# Patient Record
Sex: Female | Born: 1985
Health system: Southern US, Community
[De-identification: ages and names within clinical notes are randomized; demographics above are authoritative.]

## PROBLEM LIST (undated history)

## (undated) DIAGNOSIS — Z8619 Personal history of other infectious and parasitic diseases: Secondary | ICD-10-CM

## (undated) DIAGNOSIS — T7840XA Allergy, unspecified, initial encounter: Secondary | ICD-10-CM

## (undated) DIAGNOSIS — L509 Urticaria, unspecified: Secondary | ICD-10-CM

## (undated) DIAGNOSIS — M5020 Other cervical disc displacement, unspecified cervical region: Secondary | ICD-10-CM

## (undated) DIAGNOSIS — J45909 Unspecified asthma, uncomplicated: Secondary | ICD-10-CM

## (undated) DIAGNOSIS — D649 Anemia, unspecified: Secondary | ICD-10-CM

## (undated) DIAGNOSIS — L309 Dermatitis, unspecified: Secondary | ICD-10-CM

## (undated) HISTORY — DX: Anemia, unspecified: D64.9

## (undated) HISTORY — DX: Personal history of other infectious and parasitic diseases: Z86.19

## (undated) HISTORY — DX: Allergy, unspecified, initial encounter: T78.40XA

## (undated) HISTORY — DX: Unspecified asthma, uncomplicated: J45.909

## (undated) HISTORY — DX: Urticaria, unspecified: L50.9

## (undated) HISTORY — DX: Dermatitis, unspecified: L30.9

---
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
## 2016-01-01 LAB — HEMOGLOBIN A1C: Hemoglobin A1C: 5.5

## 2016-01-01 LAB — LIPID PANEL
CHOLESTEROL: 194 (ref 0–200)
HDL: 82 — AB (ref 35–70)
LDL CALC: 101
TRIGLYCERIDES: 55 (ref 40–160)

## 2016-01-01 LAB — BASIC METABOLIC PANEL
BUN: 6 (ref 4–21)
Creatinine: 1 (ref 0.5–1.1)
GLUCOSE: 70
Potassium: 4.1 (ref 3.4–5.3)
SODIUM: 135 — AB (ref 137–147)

## 2016-01-01 LAB — CBC AND DIFFERENTIAL
HEMATOCRIT: 36 (ref 36–46)
HEMOGLOBIN: 9.9 — AB (ref 12.0–16.0)
PLATELETS: 328 (ref 150–399)
WBC: 9.4

## 2016-01-01 LAB — PULMONARY FUNCTION TEST

## 2016-01-01 LAB — HEPATIC FUNCTION PANEL
ALK PHOS: 72 (ref 25–125)
ALT: 9 (ref 7–35)
AST: 15 (ref 13–35)
BILIRUBIN, TOTAL: 0.3

## 2016-01-01 LAB — HM PAP SMEAR

## 2016-01-01 LAB — VITAMIN D 25 HYDROXY (VIT D DEFICIENCY, FRACTURES): Vit D, 25-Hydroxy: 11.79

## 2016-01-01 LAB — HM HIV SCREENING LAB: HM HIV Screening: NEGATIVE

## 2016-01-01 LAB — HIV ANTIBODY (ROUTINE TESTING W REFLEX): HIV: NEGATIVE

## 2016-01-01 LAB — TSH: TSH: 2.14 (ref <=5.90)

## 2016-01-14 LAB — PULMONARY FUNCTION TEST

## 2016-01-19 LAB — VITAMIN B12: Vitamin B-12: 485

## 2017-06-23 ENCOUNTER — Other Ambulatory Visit: Payer: Self-pay

## 2017-06-23 ENCOUNTER — Encounter (HOSPITAL_COMMUNITY): Payer: Self-pay | Admitting: Emergency Medicine

## 2017-06-23 ENCOUNTER — Ambulatory Visit (INDEPENDENT_AMBULATORY_CARE_PROVIDER_SITE_OTHER): Payer: Self-pay | Admitting: Emergency Medicine

## 2017-06-23 ENCOUNTER — Ambulatory Visit (HOSPITAL_COMMUNITY)
Admission: EM | Admit: 2017-06-23 | Discharge: 2017-06-23 | Disposition: A | Discharge status: Home / Self Care | Payer: No Typology Code available for payment source | Attending: Family Medicine | Admitting: Family Medicine

## 2017-06-23 VITALS — BP 130/85 | HR 92 | Temp 98.3°F | Resp 16 | Wt 300.8 lb

## 2017-06-23 DIAGNOSIS — T7840XA Allergy, unspecified, initial encounter: Secondary | ICD-10-CM | POA: Diagnosis not present

## 2017-06-23 HISTORY — DX: Other cervical disc displacement, unspecified cervical region: M50.20

## 2017-06-23 MED ORDER — METHYLPREDNISOLONE ACETATE 80 MG/ML IJ SUSP
INTRAMUSCULAR | Status: AC
Start: 2017-06-23 — End: 2017-06-23
  Filled 2017-06-23: qty 1

## 2017-06-23 MED ORDER — METHYLPREDNISOLONE ACETATE 80 MG/ML IJ SUSP
80.0000 mg | Freq: Once | INTRAMUSCULAR | Status: AC
  Administered 2017-06-23: 80 mg via INTRAMUSCULAR

## 2017-06-23 NOTE — ED Provider Notes (Signed)
[facility]   [id number] 06/23/17 Arrival Time: 1817   SUBJECTIVE:  Patty Bates is a 32 y.o. female who presents to the urgent care with complaint of rash on back of neck, itchy eyes, ears, throat, chills at night. Pt requesting "shot for allergies".  She went to the symphony on Sunday night and drank wine which she states had sulfites in them.  This caused her eyes to get red and she developed an itchy rash which is only partially responded to Benadryl.   Note from other [facility] facility today: 32 year old female [facility] with a chief complaint of allergic reaction. She reports she is having a reaction to wine, and that she has had similar reactions in the past to peanuts and to wine. She states she had swelling in her throat, itching and rash over the last four days, she has treated this with benadryl and the majority of her symptoms have improved but continues to have itching on the back of her neck and the back of her legs. She denies sensation of difficulty breathing, shortness of breath, sensation of throat closing, or other acute complaints.  Past Medical History:  Diagnosis Date  . Herniated cervical disc    No family history on file. Social History   Socioeconomic History  . Marital status: Unknown    Spouse name: Not on file  . Number of children: Not on file  . Years of education: Not on file  . Highest education level: Not on file  Social Needs  . Financial resource strain: Not on file  . Food insecurity - worry: Not on file  . Food insecurity - inability: Not on file  . Transportation needs - medical: Not on file  . Transportation needs - non-medical: Not on file  Occupational History  . Not on file  Tobacco Use  . Smoking status: Not on file  Substance and Sexual Activity  . Alcohol use: Not on file  . Drug use: Not on file  . Sexual activity: Not on file  Other Topics Concern  . Not on file  Social History Narrative  . Not on file     Current Meds  Medication Sig  . DiphenhydrAMINE HCl (BENADRYL ALLERGY PO) Take by mouth.  . FERROUS GLUCONATE IRON PO Take by mouth.  . Fexofenadine HCl (ALLEGRA ALLERGY PO) Take by mouth.  Marland Kitchen. guaiFENesin (MUCINEX) 600 MG 12 hr tablet Take by mouth 2 (two) times daily.  . vitamin B-12 (CYANOCOBALAMIN) 100 MCG tablet Take 100 mcg by mouth daily.   Allergies  Allergen Reactions  . Aspirin   . Ceftriaxone   . Sulfa Antibiotics       ROS: As per HPI, remainder of ROS negative.   OBJECTIVE:   Vitals:   06/23/17 1835  BP: (!) 151/91  Pulse: 97  Resp: 18  Temp: 98.1 F (36.7 C)  SpO2: 100%     General appearance: alert; no distress Eyes: PERRL; EOMI; conjunctiva normal HENT: normocephalic; atraumatic; TMs normal, canal normal, external ears normal without trauma; nasal mucosa normal; oral mucosa normal Neck: supple Lungs: clear to auscultation bilaterally Heart: regular rate and rhythm Back: no CVA tenderness Extremities: no cyanosis or edema; symmetrical with no gross deformities Skin: warm and dry; fine sandpaper type rash over shoulders and posterior neck Neurologic: normal gait; grossly normal Psychological: alert and cooperative; normal mood and affect      Labs:  No results found for this or any previous visit.  Labs Reviewed - No data to display  No results found.     ASSESSMENT & PLAN:  1. Allergic reaction, initial encounter     Meds ordered this encounter  Medications  . methylPREDNISolone acetate (DEPO-MEDROL) injection 80 mg    Reviewed expectations re: course of current medical issues. Questions answered. Outlined signs and symptoms indicating need for more acute intervention. Patient verbalized understanding. After Visit Summary given.       Elvina Sidle, MD 06/23/17 [phone]

## 2017-06-23 NOTE — Progress Notes (Signed)
S: 32 year old female [facility] with a chief complaint of allergic reaction. She reports she is having a reaction to wine, and that she has had similar reactions in the past to peanuts and to wine. She states she had swelling in her throat, itching and rash over the last four days, she has treated this with benadryl and the majority of her symptoms have improved but continues to have itching on the back of her neck and the back of her legs. She denies sensation of difficulty breathing, shortness of breath, sensation of throat closing, or other acute complaints.  ROS otherwise negative.  O:  Vitals:   06/23/17 1738  BP: 130/85  Pulse: 92  Resp: 16  Temp: 98.3 F (36.8 C)  SpO2: 100%   Physical Exam  Constitutional: She is well-developed, well-nourished, and in no distress.  HENT:  Head: Normocephalic and atraumatic.  Right Ear: Tympanic membrane and external ear normal.  Left Ear: External ear normal.  Nose: Nose normal. Right sinus exhibits no maxillary sinus tenderness and no frontal sinus tenderness. Left sinus exhibits no maxillary sinus tenderness and no frontal sinus tenderness.  Mouth/Throat: Uvula is midline and oropharynx is clear and moist. No oropharyngeal exudate.  Eyes: Conjunctivae are normal.  Neck: Neck supple.  Cardiovascular: Normal rate and regular rhythm.  Pulmonary/Chest: Effort normal and breath sounds normal. No respiratory distress. She has no wheezes.  Lymphadenopathy:    She has no cervical adenopathy.  Neurological: She is alert.  Skin: Skin is warm and dry. No rash noted. No erythema.  Nursing note and vitals reviewed.  A: Possible allergic reaction  P: Patient requesting steroid injection tonight. It was explained that we do not have injectable medications at this location. Prescription for prednisone and Atarax offered. Patient declined. Recommend continuing Benadryl, if symptoms worsen or fail to resolve return to clinic or follow up at the  ER as needed.

## 2017-06-23 NOTE — ED Triage Notes (Signed)
Pt has taken benadryl and mucinex.

## 2017-06-23 NOTE — ED Triage Notes (Signed)
Pt c/o rash on back of neck, itchy eyes, ears, throat, chills at night. Pt requesting "shot for allergies".

## 2017-06-23 NOTE — Patient Instructions (Signed)
Allergies An allergy is when your body reacts to a substance in a way that is not normal. An allergic reaction can happen after you:  Eat something.  Breathe in something.  Touch something.  You can be allergic to:  Things that are only around during certain seasons, like molds and pollens.  Foods.  Drugs.  Insects.  Animal dander.  What are the signs or symptoms?  Puffiness (swelling). This may happen on the lips, face, tongue, mouth, or throat.  Sneezing.  Coughing.  Breathing loudly (wheezing).  Stuffy nose.  Tingling in the mouth.  A rash.  Itching.  Itchy, red, puffy areas of skin (hives).  Watery eyes.  Throwing up (vomiting).  Watery poop (diarrhea).  Dizziness.  Feeling faint or fainting.  Trouble breathing or swallowing.  A tight feeling in the chest.  A fast heartbeat. How is this diagnosed? Allergies can be diagnosed with:  A medical and family history.  Skin tests.  Blood tests.  A food diary. A food diary is a record of all the foods, drinks, and symptoms you have each day.  The results of an elimination diet. This diet involves making sure not to eat certain foods and then seeing what happens when you start eating them again.  How is this treated? There is no cure for allergies, but allergic reactions can be treated with medicine. Severe reactions usually need to be treated at a hospital. How is this prevented? The best way to prevent an allergic reaction is to avoid the thing you are allergic to. Allergy shots and medicines can also help prevent reactions in some cases. This information is not intended to replace advice given to you by your health care provider. Make sure you discuss any questions you have with your health care provider. Document Released: 09/10/2012 Document Revised: 01/11/2016 Document Reviewed: 02/25/2014 Elsevier Interactive Patient Education  2018 Elsevier Inc.  

## 2017-09-01 ENCOUNTER — Ambulatory Visit: Payer: No Typology Code available for payment source | Admitting: Physician Assistant

## 2017-09-04 ENCOUNTER — Encounter: Payer: Self-pay | Admitting: Physician Assistant

## 2017-09-04 ENCOUNTER — Ambulatory Visit (INDEPENDENT_AMBULATORY_CARE_PROVIDER_SITE_OTHER): Payer: No Typology Code available for payment source | Admitting: Physician Assistant

## 2017-09-04 VITALS — BP 150/98 | HR 94 | Temp 98.7°F | Ht 65.5 in | Wt 299.0 lb

## 2017-09-04 DIAGNOSIS — R030 Elevated blood-pressure reading, without diagnosis of hypertension: Secondary | ICD-10-CM

## 2017-09-04 DIAGNOSIS — E669 Obesity, unspecified: Secondary | ICD-10-CM | POA: Diagnosis not present

## 2017-09-04 DIAGNOSIS — R635 Abnormal weight gain: Secondary | ICD-10-CM | POA: Diagnosis not present

## 2017-09-04 DIAGNOSIS — Z7689 Persons encountering health services in other specified circumstances: Secondary | ICD-10-CM | POA: Diagnosis not present

## 2017-09-04 DIAGNOSIS — T7840XA Allergy, unspecified, initial encounter: Secondary | ICD-10-CM | POA: Diagnosis not present

## 2017-09-04 LAB — CBC WITH DIFFERENTIAL/PLATELET
BASOS PCT: 0.5 % (ref 0.0–3.0)
Basophils Absolute: 0 10*3/uL (ref 0.0–0.1)
Eosinophils Absolute: 0.1 10*3/uL (ref 0.0–0.7)
Eosinophils Relative: 1.1 % (ref 0.0–5.0)
HCT: 34.7 % — ABNORMAL LOW (ref 36.0–46.0)
HEMOGLOBIN: 11.2 g/dL — AB (ref 12.0–15.0)
LYMPHS ABS: 1.9 10*3/uL (ref 0.7–4.0)
Lymphocytes Relative: 21.8 % (ref 12.0–46.0)
MCHC: 32.2 g/dL (ref 30.0–36.0)
MCV: 77.5 fl — ABNORMAL LOW (ref 78.0–100.0)
MONO ABS: 0.4 10*3/uL (ref 0.1–1.0)
Monocytes Relative: 4.7 % (ref 3.0–12.0)
NEUTROS ABS: 6.2 10*3/uL (ref 1.4–7.7)
Neutrophils Relative %: 71.9 % (ref 43.0–77.0)
Platelets: 323 10*3/uL (ref 150.0–400.0)
RBC: 4.48 Mil/uL (ref 3.87–5.11)
RDW: 17.9 % — AB (ref 11.5–15.5)
WBC: 8.6 10*3/uL (ref 4.0–10.5)

## 2017-09-04 LAB — COMPREHENSIVE METABOLIC PANEL
ALBUMIN: 3.9 g/dL (ref 3.5–5.2)
ALT: 11 U/L (ref 0–35)
AST: 12 U/L (ref 0–37)
Alkaline Phosphatase: 72 U/L (ref 39–117)
BUN: 6 mg/dL (ref 6–23)
CHLORIDE: 102 meq/L (ref 96–112)
CO2: 27 mEq/L (ref 19–32)
Calcium: 9.4 mg/dL (ref 8.4–10.5)
Creatinine, Ser: 0.73 mg/dL (ref 0.40–1.20)
GFR: 98.92 mL/min (ref >60.00)
Glucose, Bld: 82 mg/dL (ref 70–99)
POTASSIUM: 4.4 meq/L (ref 3.5–5.1)
SODIUM: 136 meq/L (ref 135–145)
Total Bilirubin: 0.4 mg/dL (ref 0.2–1.2)
Total Protein: 7.9 g/dL (ref 6.0–8.3)

## 2017-09-04 LAB — TSH: TSH: 1.15 u[IU]/mL (ref 0.35–4.50)

## 2017-09-04 LAB — HEMOGLOBIN A1C: HEMOGLOBIN A1C: 5.8 % (ref 4.6–6.5)

## 2017-09-04 MED ORDER — EPINEPHRINE 0.3 MG/0.3ML IJ SOAJ: 0.3000 mg | Freq: Once | INTRAMUSCULAR | 0 refills | Status: AC

## 2017-09-04 MED FILL — EPINEPHRINE 0.3 MG AUTO-INJ: 0.3 | 30 days supply | Qty: 2 | Fill #0

## 2017-09-04 NOTE — Patient Instructions (Signed)
It was great to meet you!  Keep an eye on blood pressure - if consistently >140/90, please let me know.  You will be contacted about your allergy referral.

## 2017-09-04 NOTE — Progress Notes (Signed)
Patty Bates is a 32 y.o. female here to Establish Care  I acted as a Neurosurgeon for [employer], PA-C Corky Mull, LPN  History of Present Illness:   Chief Complaint  Patient presents with  . Establish Care  . Allergies  . Diet and Nutrition    Acute Concerns: Elevated blood pressure reading -- Currently not taking any medications for blood pressure. At home blood pressure readings are: 140/100.  Patient denies chest pain, SOB, blurred vision, dizziness, unusual headaches, lower leg swelling. Denies excessive caffeine intake, stimulant usage, excessive alcohol intake, or increase in salt consumption. Uses "some salt." Eats out 50% of the time. Was under a lot of stress over the past year but lately things have improved. She is interested in diet and nutrition ans she thinks that her weight gain is contributing to this. BP Readings from Last 3 Encounters:  09/04/17 (!) 150/98  06/23/17 (!) 151/91  06/23/17 130/85   Allergies -- has issues with eczema. Takes Allegra daily. 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 Had a pretty severe reaction to wine in January 2019 (swelling in throat, itching/rash), required depo-medrol. Does not have Epi-Pen. Obesity -- was 260 lb last year, was working out at [employer]	[place]. Had to discontinue to her membership because her back pain was so severe. Her pain has improved and now is walking and doing beginner Zumba. She feels like her recent weight gain is attributing to her blood pressure.   Health Maintenance: Immunizations -- up to date Weight -- Weight: 299 lb (135.6 kg)    Depression screen [facility] 2/9 09/04/2017  Decreased Interest 0  Down, Depressed, Hopeless 0  PHQ - 2 Score 0    No flowsheet data found.    Past Medical History:  Diagnosis Date  . Allergy   . Anemia   . Herniated cervical disc   . History of chicken pox      Social History   Socioeconomic History  . Marital status: Unknown    Spouse name: Not  on file  . Number of children: Not on file  . Years of education: Not on file  . Highest education level: Not on file  Occupational History  . Not on file  Social Needs  . Financial resource strain: Not on file  . Food insecurity:    Worry: Not on file    Inability: Not on file  . Transportation needs:    Medical: Not on file    Non-medical: Not on file  Tobacco Use  . Smoking status: Never Smoker  . Smokeless tobacco: Never Used  Substance and Sexual Activity  . Alcohol use: Not Currently    Frequency: Never    Comment: rarely  . Drug use: Never  . Sexual activity: Never    Birth control/protection: None  Lifestyle  . Physical activity:    Days per week: Not on file    Minutes per session: Not on file  . Stress: Not on file  Relationships  . Social connections:    Talks on phone: Not on file    Gets together: Not on file    Attends religious service: Not on file    Active member of club or organization: Not on file    Attends meetings of clubs or organizations: Not on file    Relationship status: Not on file  . Intimate partner violence:    Fear of current or ex partner: Not on file    Emotionally abused: Not on  file    Physically abused: Not on file    Forced sexual activity: Not on file  Other Topics Concern  . Not on file  Social History Narrative   Cardiac sonographer for [employer]   Single   No children   Fun: travel    Past Surgical History:  Procedure Laterality Date  . WISDOM TOOTH EXTRACTION Bilateral 2004    Family History  Problem Relation Age of Onset  . Prostate cancer Father   . Breast cancer Neg Hx   . Colon cancer Neg Hx   . Stroke Neg Hx   . Heart attack Neg Hx   . Diabetes Neg Hx     Allergies  Allergen Reactions  . Aspirin   . Ceftriaxone   . Sulfa Antibiotics      Current Medications:   Current Outpatient Medications:  .  DiphenhydrAMINE HCl (BENADRYL ALLERGY PO), Take 1 tablet by mouth as needed. , Disp: , Rfl:  .   EPINEPHrine 0.3 mg/0.3 mL IJ SOAJ injection, Inject 0.3 mLs (0.3 mg total) into the muscle once for 1 dose., Disp: 1 Device, Rfl: 0 .  FERROUS GLUCONATE IRON PO, Take 1 tablet by mouth daily. , Disp: , Rfl:  .  Fexofenadine HCl (ALLEGRA ALLERGY PO), Take 1 tablet by mouth daily. , Disp: , Rfl:  .  vitamin B-12 (CYANOCOBALAMIN) 100 MCG tablet, Take 100 mcg by mouth daily., Disp: , Rfl:    Review of Systems:   ROS  Negative unless otherwise specified per HPI.   Vitals:   Vitals:   09/04/17 1100 09/04/17 1126  BP: (!) 150/96 (!) 150/98  Pulse: 94   Temp: 98.7 F (37.1 C)   TempSrc: Oral   SpO2: 99%   Weight: 299 lb (135.6 kg)   Height: 5' 5.5" (1.664 m)      Body mass index is 49 kg/m.  Physical Exam:   Physical Exam  Constitutional: She appears well-developed. She is cooperative.  Non-toxic appearance. She does not have a sickly appearance. She does not appear ill. No distress.  Cardiovascular: Normal rate, regular rhythm, S1 normal, S2 normal, normal heart sounds and normal pulses.  No LE edema  Pulmonary/Chest: Effort normal and breath sounds normal.  Neurological: She is alert. GCS eye subscore is 4. GCS verbal subscore is 5. GCS motor subscore is 6.  Skin: Skin is warm, dry and intact.  No rashes today  Psychiatric: She has a normal mood and affect. Her speech is normal and behavior is normal.  Nursing note and vitals reviewed.   Assessment and Plan:    Patty Bates was seen today for establish care, allergies and diet and nutrition.  Diagnoses and all orders for this visit:  Allergic state, initial encounter Provided patient with Epi-Pen rx at today's visit. Continue Allegra. She would like a referral to an Allergist for further testing as her reactions seem to be worsening. I have put this in for her. I recommended that if she has any severe reactions, go to the ER and carry Epi-Pen with her wherever she goes. -     Ambulatory referral to Allergy  Weight gain and  Obesity She is planning to return for nutrition visit with me for discussion of weight loss. -     Hemoglobin A1c  Elevated blood pressure reading No red flags on exam today. Will check labs to r/o organic cause. I would like for her to follow up with us in 2 weeks when she returns for weight  loss discussion to allow [place] to re-check her BP. I asked her to keep a blood pressure log in the meantime. Discussed blood pressure medication options, we are considering Norvasc or Lisinopril.  -     TSH -     CBC with Differential/Platelet -     Comprehensive metabolic panel  Encounter to establish care  Other orders -     EPINEPHrine 0.3 mg/0.3 mL IJ SOAJ injection; Inject 0.3 mLs (0.3 mg total) into the muscle once for 1 dose.    . Reviewed expectations re: course of current medical issues. . Discussed self-management of symptoms. . Outlined signs and symptoms indicating need for more acute intervention. . Patient verbalized understanding and all questions were answered. . See orders for this visit as documented in the electronic medical record. . Patient received an After-Visit Summary.   CMA or LPN served as scribe during this visit. History, Physical, and Plan performed by medical provider. Documentation and orders reviewed and attested to.   Jarold Motto, PA-C

## 2017-09-05 ENCOUNTER — Encounter: Payer: Self-pay | Admitting: Physician Assistant

## 2017-09-05 ENCOUNTER — Encounter: Payer: Self-pay | Admitting: *Deleted

## 2017-09-05 DIAGNOSIS — D649 Anemia, unspecified: Secondary | ICD-10-CM | POA: Insufficient documentation

## 2017-09-05 DIAGNOSIS — E559 Vitamin D deficiency, unspecified: Secondary | ICD-10-CM | POA: Insufficient documentation

## 2017-09-05 LAB — FERRITIN: Ferritin: 3.6

## 2017-09-05 LAB — FOLATE: Folate: 7.31

## 2017-10-02 ENCOUNTER — Ambulatory Visit: Payer: No Typology Code available for payment source | Admitting: Allergy & Immunology

## 2017-10-02 ENCOUNTER — Encounter: Payer: Self-pay | Admitting: Allergy & Immunology

## 2017-10-02 VITALS — BP 134/96 | HR 88 | Temp 98.2°F | Resp 16 | Ht 64.8 in | Wt 303.8 lb

## 2017-10-02 DIAGNOSIS — J683 Other acute and subacute respiratory conditions due to chemicals, gases, fumes and vapors: Secondary | ICD-10-CM

## 2017-10-02 DIAGNOSIS — T781XXD Other adverse food reactions, not elsewhere classified, subsequent encounter: Secondary | ICD-10-CM

## 2017-10-02 DIAGNOSIS — J310 Chronic rhinitis: Secondary | ICD-10-CM | POA: Diagnosis not present

## 2017-10-02 DIAGNOSIS — R21 Rash and other nonspecific skin eruption: Secondary | ICD-10-CM

## 2017-10-02 MED ORDER — ALBUTEROL SULFATE HFA 108 (90 BASE) MCG/ACT IN AERS: 2.0000 | INHALATION_SPRAY | RESPIRATORY_TRACT | 1 refills | Status: DC | PRN

## 2017-10-02 MED FILL — VENTOLIN HFA 90 MCG INHALER: 108 (90 BAS | 18 days supply | Qty: 18 | Fill #0

## 2017-10-02 NOTE — Progress Notes (Signed)
NEW PATIENT  Date of Service/Encounter:  10/02/17  Referring provider: Jarold Motto, PA   Assessment:   Reactive airways dysfunction syndrome  Adverse food reaction - with questionable reactions not consistent with a food allergy  Chronic rhinitis  Plan/Recommendations:   1. Reactive airways dysfunction syndrome - Your history is most consistent with reactive airway dysfunction syndrome, in which your lungs become hyper-reactive to scents and certain triggers. - In particularly serious cases, we can consider starting a daily controller medication, such as an inhaled steroid. - However you seem to have your symptoms controlled with avoidance.  - I would recommend using 4 puffs of albuterol every 4-6 hours as needed for the coughing episodes.   2. Adverse food reaction - multiple intolerances - Testing was positive to: Cottonseed, White Potato and [employer] - Avoid the above foods for now.  - We will get blood work to look for a red meat allergy. - Testing was negative to Peanut, Soy, Wheat, Sesame, Milk, Egg, Casein, Shellfish Mix , Fish Mix, Cashew, [place], [place], [place], [place], [place] nut, [place], [place], [place], [place], [place], [place], [place], [place], [employer] [place], [place], [place], [place], [place], [place], Box, Oat, Rye, Hops, Rice, YUM[employer] Brands, Sunflower, [place], Chicken, [place], [place], Tomato, Sweet Potato, Green Pea, Mushroom, Avocado, Onion, Cabbage, Carrots, Celery, Corn, Cucumber, Grape, Orange, Banana, Apple, Peach, Strawberry, Cantaloupe, Watermelon , Pineapple, Chocolate, Karaya Gum, Acacia (Arabic Gum), Cinnamon , Nutmeg, Ginger, Garlic, Black Pepper and Mustard - Training for epinephrine auto-injectors provided: EpiPen - There is a the low positive predictive value of food allergy testing and hence the high possibility of false positives. - In contrast, food allergy testing has a high negative predictive value, therefore if testing is negative we can be  relatively assured that they are indeed negative.  - We will get some screening labs for autoimmune issues as well since your history is rather odd. - We will get your outside records from [facility].   3. Return in about 3 months (around 01/02/2018).  Subjective:   COLE KLUGH is a 32 y.o. female presenting today for evaluation of  Chief Complaint  Patient presents with  . Rash  . Ear Problem  . Hypertension    while off antihistamine    Chantele G Giannetti has a history of the following: Patient Active Problem List   Diagnosis Date Noted  . Vitamin D deficiency 09/05/2017  . Anemia 09/05/2017    History obtained from: chart review and patient.  Kimberli G Starner was referred by Jarold Motto, PA.      Rey is a 32 y.o. female presenting for evaluation of a "rashes, coughing, and blood pressure increases". These seem to be triggered by foods, although the history is quite vague. She is a poor historian overall.   These rash sticks around for a week or two. This is pruritic and the rash turns slightly dark and dusky before resolving. They are typically located on the back of her neck, back of thighs, and on her chest. She estimates that she has these around once every couple of months. Apples are definitely a trigger, and she lists a multitude of other foods as well. She has reactions to rice, including rectal bleeding for five days. She reports rash and hypertension with certain foods. Apples are one trigger. She thinks that maple is a trigger as well. Pineapples cause blurred vision. Nuts causes a rash "all over" within 4-5 hours. Peanuts, cashews, almonds, and walnuts result in hives. She does  eat meat twice daily ([place] sausage, chicken/fish/hambutrger for lunch or dinner). She seems to have a reaction to every food when asked about it, although none of these seem to be consistent with a food allergy. She does have an EpiPen. She denies tick bites.   She went to  Urgent Care after she was exposed to wine fumes at the opera. This was in January 2019. She now stays away from places that serve alcohol. The same reaction occurs with vinegar. The wine and vinegar and watermelon fumes trigger her breathing problems with sinus flaring and drainage in the back of her throat. She does not have a diagnosis of asthma and is not on a daily controller medications. She works at [employer]. 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 She stays away from soap scents and perfumes.   She did have testing performed for foods and environmental. These were done at [facility] within the last few years, although she is not aware of the findings whatsoever.   Asthma/Respiratory Symptom History: She has had a pulmonary function testing performed that was  "negative for asthma" [facility]). She is not sure whether there was a methacholine challenge performed. She reports that there was no reversibility, although again I do not have the testing results with me today.   Allergic Rhinitis Symptom History: She does have a reaction to tree pollens. She is currently on Allegra daily and Benadryl at night as needed. She had testing performed in 2018 or 2017. She does not want to be retested for environmental allergens today.    Otherwise, there is no history of other atopic diseases, including asthma, stinging insect allergies, or urticaria. There is no significant infectious history. Vaccinations are up to date.    Past Medical History: Patient Active Problem List   Diagnosis Date Noted  . Vitamin D deficiency 09/05/2017  . Anemia 09/05/2017    Medication List:  Allergies as of 10/02/2017      Reactions   Aspirin    Ceftriaxone    Sulfa Antibiotics       Medication List        Accurate as of 10/02/17 11:59 PM. Always use your most recent med list.          albuterol 108  (90 Base) MCG/ACT inhaler Commonly known as:  PROAIR HFA Inhale 2 puffs into the lungs every 4 (four) hours as needed for wheezing or shortness of breath.   ALLEGRA ALLERGY PO Take 1 tablet by mouth daily.   BENADRYL ALLERGY PO Take 1 tablet by mouth as needed.   EPINEPHrine 0.3 mg/0.3 mL Soaj injection Commonly known as:  EPI-PEN   FERROUS GLUCONATE IRON PO Take 1 tablet by mouth daily.   vitamin B-12 100 MCG tablet Commonly known as:  CYANOCOBALAMIN Take 100 mcg by mouth daily.       Birth History: non-contributory.   Developmental History: non-contributory.   Past Surgical History: Past Surgical History:  Procedure Laterality Date  . WISDOM TOOTH EXTRACTION Bilateral 2004     Family History: Family History  Problem Relation Age of Onset  . Prostate cancer Father   . Hyperlipidemia Father   . Eczema Sister   . Kidney disease Maternal Grandmother   . Arthritis Maternal Grandmother   . Arthritis Paternal Grandmother   . Food Allergy Maternal Aunt        tree nuts  .  Breast cancer Neg Hx   . Colon cancer Neg Hx   . Stroke Neg Hx   . Heart attack Neg Hx   . Diabetes Neg Hx   . Allergic rhinitis Neg Hx   . Atopy Neg Hx   . Urticaria Neg Hx      Social History: Vallerie lives at home in an apartment that is 31 years old. There is laminate in the main living areas and carpeting in the bedrooms. They have electric heating and central cooling. There are no animals inside of the home. There are no dust mite coverings on the bedding. There is no tobacco exposure in the home. 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     Review of Systems: a 14-point review of systems is pertinent for what is mentioned in HPI.  Otherwise, all other systems were negative. Constitutional: negative other than that listed in the HPI Eyes: negative other than that listed in  the HPI Ears, nose, mouth, throat, and face: negative other than that listed in the HPI Respiratory: negative other than that listed in the HPI Cardiovascular: negative other than that listed in the HPI Gastrointestinal: negative other than that listed in the HPI Genitourinary: negative other than that listed in the HPI Integument: negative other than that listed in the HPI Hematologic: negative other than that listed in the HPI Musculoskeletal: negative other than that listed in the HPI Neurological: negative other than that listed in the HPI Allergy/Immunologic: negative other than that listed in the HPI    Objective:   Blood pressure (!) 134/96, pulse 88, temperature 98.2 F (36.8 C), temperature source Oral, resp. rate 16, height 5' 4.8" (1.646 m), weight (!) 303 lb 12.8 oz (137.8 kg). Body mass index is 50.87 kg/m.   Physical Exam:  General: Alert, interactive, in no acute distress. Talkative.  Eyes: No conjunctival injection bilaterally, no discharge on the right, no discharge on the left and no Horner-Trantas dots present. PERRL bilaterally. EOMI without pain. No photophobia.  Ears: Right TM pearly gray with normal light reflex, Left TM pearly gray with normal light reflex, Right TM intact without perforation and Left TM intact without perforation.  Nose/Throat: External nose within normal limits and septum midline. Turbinates edematous with clear discharge. Posterior oropharynx erythematous without cobblestoning in the posterior oropharynx. Tonsils 2+ without exudates.  Tongue without thrush. Neck: Supple without thyromegaly. Trachea midline. Adenopathy: no enlarged lymph nodes appreciated in the anterior cervical, occipital, axillary, epitrochlear, inguinal, or popliteal regions. Lungs: Clear to auscultation without wheezing, rhonchi or rales. No increased work of breathing. CV: Normal S1/S2. No murmurs. Capillary refill <2 seconds.  Abdomen: Nondistended, nontender. No  guarding or rebound tenderness. Bowel sounds present in all fields and hypoactive  Skin: Warm and dry, without lesions or rashes. Extremities:  No clubbing, cyanosis or edema. Neuro:   Grossly intact. No focal deficits appreciated. Responsive to questions.  Diagnostic studies:   Allergy Studies:   Full Food Panel: positive to Cottonseed (2x2), White Potato (2x2) and [employer] (2x2) with adequate controls. Negative to Peanut, Soy, Wheat, Sesame, Milk, Egg, Casein, Shellfish Mix , Fish Mix, Cashew, [place], [address], [place], [place], [place] nut, [place], [place], [place], [place], [place], [place], [place], [place], [place], [place], [place], [place], [place], [place], [place], Oat, Rye, Hops, [employer], [address], [place], [place], Chicken, [place], [place], Tomato, Sweet Potato, Green Pea, Mushroom, Avocado, Onion, Cabbage, Carrots, [place],  Corn, Cucumber, Grape, Orange, Banana, Apple, Peach, Strawberry, Cantaloupe, Watermelon , Pineapple, Chocolate, Karaya Gum, Acacia (Arabic Gum), Cinnamon , Nutmeg, Ginger, Garlic, Black Pepper and Mustard   Allergy testing results were read and interpreted by myself, documented by clinical staff.       Malachi Bonds, MD [facility]

## 2017-10-02 NOTE — Patient Instructions (Addendum)
1. Reactive airways dysfunction syndrome - Your history is most consistent with reactive airway dysfunction syndrome, in which your lungs become hyper-reactive to scents and certain triggers. - In particularly serious cases, we can consider starting a daily controller medication, such as an inhaled steroid. - However you seem to have your symptoms controlled with avoidance.  - I would recommend using 4 puffs of albuterol every 4-6 hours as needed for the coughing episodes.   2. Adverse food reaction - multiple intolerances - Testing was positive to: Cottonseed, White Potato and [employer] - Avoid the above foods for now.  - We will get blood work to look for a red meat allergy. - Testing was negative to Peanut, Soy, Wheat, Sesame, Milk, Egg, Casein, Shellfish Mix , Fish Mix, Cashew, [place], [place], [place], [place], [place] nut, [place], [place], [place], [place], [place], [place], [place], [place], [place], [place], [place], [place], [place], [place], [place], Oat, Rye, Hops, Rice, Y[employer]ands, [place], [place], Chicken, [place], [place], Tomato, Sweet Potato, Green Pea, Mushroom, Avocado, Onion, Cabbage, Carrots, Celery, Corn, Cucumber, Grape, Orange, Banana, Apple, Peach, Strawberry, Cantaloupe, Watermelon , Pineapple, Chocolate, Karaya Gum, Acacia (Arabic Gum), Cinnamon , Nutmeg, Ginger, Garlic, Black Pepper and Mustard - Training for epinephrine auto-injectors provided: EpiPen - There is a the low positive predictive value of food allergy testing and hence the high possibility of false positives. - In contrast, food allergy testing has a high negative predictive value, therefore if testing is negative we can be relatively assured that they are indeed negative.  - We will get some screening labs for autoimmune issues as well since your history is rather odd. - We will get your outside records from [facility].   3. Return in about 3 months (around 01/02/2018).   Please inform us of any  Emergency Department visits, hospitalizations, or changes in symptoms. Call us before going to the ED for breathing or allergy symptoms since we might be able to fit you in for a sick visit. Feel free to contact us anytime with any questions, problems, or concerns.  It was a pleasure to meet you today!  Websites that have reliable patient information: 1. American Academy of Asthma, Allergy, and Immunology: www.aaaai.org 2. Food Allergy Research and Education (FARE): foodallergy.org 3. Mothers of Asthmatics: http://www.asthmacommunitynetwork.org 4. American College of Allergy, Asthma, and Immunology: www.acaai.org

## 2017-10-05 ENCOUNTER — Encounter: Payer: Self-pay | Admitting: Allergy & Immunology

## 2017-10-05 DIAGNOSIS — J683 Other acute and subacute respiratory conditions due to chemicals, gases, fumes and vapors: Secondary | ICD-10-CM | POA: Insufficient documentation

## 2017-10-05 DIAGNOSIS — J310 Chronic rhinitis: Secondary | ICD-10-CM | POA: Insufficient documentation

## 2017-10-05 DIAGNOSIS — T781XXA Other adverse food reactions, not elsewhere classified, initial encounter: Secondary | ICD-10-CM | POA: Insufficient documentation

## 2017-10-10 LAB — TRYPTASE: TRYPTASE: 3.4 ug/L (ref 2.2–13.2)

## 2017-10-10 LAB — ALPHA-GAL PANEL
Beef (Bos spp) IgE: <0.1 kU/L (ref <0.35)
Class Interpretation: 0
LAMB CLASS INTERPRETATION: 0
Lamb/Mutton (Ovis spp) IgE: <0.1 kU/L (ref <0.35)
PORK CLASS INTERPRETATION: 0
Pork (Sus spp) IgE: <0.1 kU/L (ref <0.35)

## 2017-10-10 LAB — ANA: ANA: POSITIVE — AB

## 2017-10-10 LAB — IGE+ALLERGENS ZONE 2(30)
Amer Sycamore IgE Qn: 0.39 kU/L — AB
Bahia Grass IgE: 0.11 kU/L — AB
Cat Dander IgE: <0.1 kU/L
Cedar, Mountain IgE: 0.11 kU/L — AB
Cockroach, American IgE: <0.1 kU/L
Common Silver Birch IgE: 1.42 kU/L — AB
D Farinae IgE: <0.1 kU/L
Dog Dander IgE: <0.1 kU/L
Elm, American IgE: 3.3 kU/L — AB
G010-IGE JOHNSON GRASS: 0.14 kU/L — AB
Hickory, White IgE: 0.86 kU/L — AB
IGE (IMMUNOGLOBULIN E), SERUM: 154 [IU]/mL (ref 6–495)
Mucor Racemosus IgE: <0.1 kU/L
Nettle IgE: 0.25 kU/L — AB
Oak, White IgE: 5.88 kU/L — AB
Penicillium Chrysogen IgE: <0.1 kU/L
Pigweed, Rough IgE: 0.8 kU/L — AB
Ragweed, Short IgE: 1.42 kU/L — AB
SWEET GUM IGE RAST QL: 0.83 kU/L — AB
T001-IGE MAPLE/BOX ELDER: 0.38 kU/L — AB
Timothy Grass IgE: <0.1 kU/L
White Mulberry IgE: <0.1 kU/L

## 2017-10-10 LAB — SEDIMENTATION RATE: Sed Rate: 40 mm/hr — ABNORMAL HIGH (ref 0–32)

## 2017-10-10 LAB — C-REACTIVE PROTEIN: CRP: 10.8 mg/L — ABNORMAL HIGH (ref 0.0–4.9)

## 2017-10-11 ENCOUNTER — Encounter: Payer: Self-pay | Admitting: Physician Assistant

## 2017-10-11 ENCOUNTER — Ambulatory Visit (INDEPENDENT_AMBULATORY_CARE_PROVIDER_SITE_OTHER): Payer: No Typology Code available for payment source | Admitting: Physician Assistant

## 2017-10-11 VITALS — BP 150/86 | HR 108 | Temp 99.2°F | Ht 65.5 in | Wt 307.0 lb

## 2017-10-11 DIAGNOSIS — E669 Obesity, unspecified: Secondary | ICD-10-CM | POA: Diagnosis not present

## 2017-10-11 DIAGNOSIS — R030 Elevated blood-pressure reading, without diagnosis of hypertension: Secondary | ICD-10-CM | POA: Diagnosis not present

## 2017-10-11 DIAGNOSIS — R635 Abnormal weight gain: Secondary | ICD-10-CM | POA: Diagnosis not present

## 2017-10-11 DIAGNOSIS — B351 Tinea unguium: Secondary | ICD-10-CM | POA: Diagnosis not present

## 2017-10-11 DIAGNOSIS — Z713 Dietary counseling and surveillance: Secondary | ICD-10-CM

## 2017-10-11 DIAGNOSIS — L309 Dermatitis, unspecified: Secondary | ICD-10-CM

## 2017-10-11 NOTE — Progress Notes (Signed)
Patty Bates is a 32 y.o. female here for Nutrition Consult.  I acted as a Neurosurgeon for [employer], PA-C Corky Mull, LPN  History of Present Illness:   Chief Complaint  Patient presents with  . Nutrition Counseling    HPI   Patient is here to discuss Nutrition and Diet. Pt would like to lose 40 pounds by end of the year. Highest weight is now at 307 lbs, lowest weight 235 lbs 10 years ago. Pt is exercising, walking 30 minutes twice a week for the past 6 months.  Dietary recall: Breakfast: 2 biscuits, [place] sausage, water OR skips on weekend Lunch: food from cafeteria ([place], mashed potatoes, green beans) or [place] [employer] turkey's sub Dinner: often mixed vegetables, protein, and a carbohydrate Snacks: sometimes fruit, reports that she has a chocolate chip cookie every day Beverages: mostly water  She describes multiple food allergies/intolerances.  She is currently being followed by Dr. Malachi Bonds at allergy and asthma.  He last saw her on Oct 02, 2017.  I have reviewed his note.  Allergies/intolerances include: Apples -- rash Rice -- rectal bleeding Maple Pineapples -- blurred vision Nuts -- rash "all over" within 4-5 hours (peanuts, cashews, almonds and walnuts) Wine Vinegar Watermelon  Full allergy food panel performed at allergy center shows confirmed allergy to: cottonseed, white potato and navy bean.  Dr. Dellis Anes performed autoimmune testing, she was found to have an increased sed rate, increased C-reactive protein, and a positive ANA.  She has been referred to rheumatology.  Per his note "she seems to have a reaction to every food when asked about it"  Weight: Wt Readings from Last 3 Encounters:  10/11/17 (!) 307 lb (139.3 kg)  10/02/17 (!) 303 lb 12.8 oz (137.8 kg)  09/04/17 299 lb (135.6 kg)   Exercise: Tries to walk 30 minutes a day  Goals: 1-lose weight 2-reduce blood pressure through diet  Estimated daily energy needs: Calories: 1600 -  1800 kcal Protein: 60-75 g Fluid: 2000 ml  Elevated blood pressure Currently not on medications. At home blood pressure readings are: <130 systolic and <90 diastolic. Patient denies chest pain, SOB, blurred vision, dizziness, unusual headaches, lower leg swelling. Denies excessive caffeine intake, stimulant usage, excessive alcohol intake, or increase in salt consumption.  Patient does report that around her.  She does take an increased dose of ibuprofen, and with this she developed a slight increase in her blood pressure, of her prior readings they are still within goal.  Eczema She continues to struggle with eczema she has for most of her life.  Currently using over-the-counter remedies which are not helping.  Toenail Fungus She reports that she has been dealing with toenail fungus for almost her entire life.  She has tried some over-the-counter creams without any help.  She would like to see someone to help her with this, she can not clip her toenails due to this.  Past Medical History:  Diagnosis Date  . Allergy   . Anemia   . Eczema   . Herniated cervical disc   . History of chicken pox      Social History   Socioeconomic History  . Marital status: Single    Spouse name: Not on file  . Number of children: Not on file  . Years of education: Not on file  . Highest education level: Not on file  Occupational History  . Not on file  Social Needs  . Financial resource strain: Not on file  .  Food insecurity:    Worry: Not on file    Inability: Not on file  . Transportation needs:    Medical: Not on file    Non-medical: Not on file  Tobacco Use  . Smoking status: Never Smoker  . Smokeless tobacco: Never Used  Substance and Sexual Activity  . Alcohol use: Not Currently    Frequency: Never    Comment: rarely  . Drug use: Never  . Sexual activity: Never    Birth control/protection: None  Lifestyle  . Physical activity:    Days per week: Not on file    Minutes per  session: Not on file  . Stress: Not on file  Relationships  . Social connections:    Talks on phone: Not on file    Gets together: Not on file    Attends religious service: Not on file    Active member of club or organization: Not on file    Attends meetings of clubs or organizations: Not on file    Relationship status: Not on file  . Intimate partner violence:    Fear of current or ex partner: Not on file    Emotionally abused: Not on file    Physically abused: Not on file    Forced sexual activity: Not on file  Other Topics Concern  . Not on file  Social History Narrative   Cardiac sonographer for [employer]   Single   No children   Fun: travel    Past Surgical History:  Procedure Laterality Date  . WISDOM TOOTH EXTRACTION Bilateral 2004    Family History  Problem Relation Age of Onset  . Prostate cancer Father   . Hyperlipidemia Father   . Eczema Sister   . Kidney disease Maternal Grandmother   . Arthritis Maternal Grandmother   . Arthritis Paternal Grandmother   . Food Allergy Maternal Aunt        tree nuts  . Breast cancer Neg Hx   . Colon cancer Neg Hx   . Stroke Neg Hx   . Heart attack Neg Hx   . Diabetes Neg Hx   . Allergic rhinitis Neg Hx   . Atopy Neg Hx   . Urticaria Neg Hx     Allergies  Allergen Reactions  . Aspirin   . Ceftriaxone   . Sulfa Antibiotics     Current Medications:   Current Outpatient Medications:  .  albuterol (PROAIR HFA) 108 (90 Base) MCG/ACT inhaler, Inhale 2 puffs into the lungs every 4 (four) hours as needed for wheezing or shortness of breath., Disp: 18 g, Rfl: 1 .  DiphenhydrAMINE HCl (BENADRYL ALLERGY PO), Take 1 tablet by mouth as needed. , Disp: , Rfl:  .  EPINEPHrine 0.3 mg/0.3 mL IJ SOAJ injection, , Disp: , Rfl: 0 .  FERROUS GLUCONATE IRON PO, Take 1 tablet by mouth daily. , Disp: , Rfl:  .  Fexofenadine HCl (ALLEGRA ALLERGY PO), Take 1 tablet by mouth daily. , Disp: , Rfl:  .  vitamin B-12 (CYANOCOBALAMIN) 100 MCG  tablet, Take 100 mcg by mouth daily., Disp: , Rfl:    Review of Systems:   ROS  Negative unless otherwise specified per HPI.   Vitals:   Vitals:   10/11/17 1545  BP: (!) 150/86  Pulse: (!) 108  Temp: 99.2 F (37.3 C)  TempSrc: Oral  SpO2: 99%  Weight: (!) 307 lb (139.3 kg)  Height: 5' 5.5" (1.664 m)     Body mass index is  50.31 kg/m.  Physical Exam:   Physical Exam  Constitutional: She is oriented to person, place, and time. She appears well-developed and well-nourished. She is cooperative.  Non-toxic appearance. She does not have a sickly appearance. She does not appear ill. No distress.  HENT:  Head: Normocephalic and atraumatic.  Eyes: Conjunctivae and EOM are normal.  Neck: Normal range of motion. Neck supple.  Cardiovascular: Normal rate, regular rhythm, S1 normal, S2 normal, normal heart sounds and normal pulses.  No LE edema  Pulmonary/Chest: Effort normal and breath sounds normal.  Musculoskeletal: Normal range of motion.  Neurological: She is alert and oriented to person, place, and time. GCS eye subscore is 4. GCS verbal subscore is 5. GCS motor subscore is 6.  Skin: Skin is warm, dry and intact.  Sandpaperlike rash to chin, no evidence of discharge or  tenderness.    Multiple toenails bilaterally thickened, yellow/dark gray with visible deformities.  Psychiatric: She has a normal mood and affect. Her speech is normal and behavior is normal. Judgment and thought content normal.  Nursing note and vitals reviewed.   Assessment and Plan:    Kerryn was seen today for nutrition counseling.  Diagnoses and all orders for this visit:  Obesity, unspecified classification, unspecified obesity type, unspecified whether serious comorbidity present; Weight gain; Encounter for nutritional counseling Current BMI is 50.  We did complete nutritional counseling today.  Patient has an extensive number of food allergies and intolerances, it is very difficult to make  specific recommendations that she thinks that she can tolerate.  I am going to offer a referral to an outpatient registered dietitian for more formal education.  Continue to work on exercise as tolerated.  Elevated blood pressure reading Blood pressure readings at home are normal.  We will not start medication today.  I recommended that she continue to keep a log of her blood pressures and notify us if her blood pressures are consistently greater than 140/90.  I also recommended that she avoid excessive amounts of NSAIDs.  Onychomycosis Referral to podiatrist placed today.  Eczema Referral to dermatologist placed today.     . Reviewed expectations re: course of current medical issues. . Discussed self-management of symptoms. . Outlined signs and symptoms indicating need for more acute intervention. . Patient verbalized understanding and all questions were answered. . See orders for this visit as documented in the electronic medical record. . Patient received an After-Visit Summary.  CMA or LPN served as scribe during this visit. History, Physical, and Plan performed by medical provider. Documentation and orders reviewed and attested to.  Jarold Motto, PA-C

## 2017-10-11 NOTE — Patient Instructions (Addendum)
Thanks for coming back to see me!  I will put your referrals for the podiatrist and dermatologist in at this time. Please contact [facility] after tomorrow to let them know I have put the referrals in for you.

## 2017-10-11 NOTE — Addendum Note (Signed)
Addended by: Alfonse Spruce on: 10/11/2017 09:27 AM   Modules accepted: Orders

## 2017-10-12 ENCOUNTER — Encounter: Payer: Self-pay | Admitting: Physician Assistant

## 2017-10-12 DIAGNOSIS — L309 Dermatitis, unspecified: Secondary | ICD-10-CM | POA: Insufficient documentation

## 2017-10-13 ENCOUNTER — Telehealth: Payer: Self-pay

## 2017-10-13 NOTE — Addendum Note (Signed)
Addended by: Mliss Fritz I on: 10/13/2017 08:06 AM   Modules accepted: Orders

## 2017-10-13 NOTE — Telephone Encounter (Signed)
-----   Message from Alfonse Spruce, MD sent at 10/11/2017  9:27 AM EDT ----- Rheumatology referral placed.

## 2017-10-30 ENCOUNTER — Ambulatory Visit: Payer: No Typology Code available for payment source | Admitting: Allergy & Immunology

## 2017-11-13 NOTE — Progress Notes (Signed)
Office Visit Note  Patient: Patty Bates             Date of Birth: [birth year]           MRN: [id number]             PCP: Jarold Motto, PA Referring: Alfonse Spruce, * Visit Date: 11/23/2017 Occupation: Cardiac sonographer    Subjective:  Positive ANA and rash.   History of Present Illness: Patty Bates is a 32 y.o. female seen in consultation per request of her allergist.  According to patient she has had increased allergies and the last 9 years.  Since 2010 she has been experiencing a skin rash, increased episodes of eczema, low-grade fever, itching, diarrhea and hypertension.  She has noticed that her allergies towards food and medications increased over time.  She recently went to see Dr. Dellis Anes who did allergy testing and told her some dietary modifications which has been very helpful.  She has noticed some improvement in her rash, diarrhea and hypertension.  She still has intermittent fever.  At the time she had lab work which showed positive ANA and elevated sedimentation rate for the reason she was referred to me.  She denies any joint pain or joint swelling there is no history of Raynaud's phenomenon there is no history of oral ulcers or nasal ulcers.  Activities of Daily Living:  Patient reports morning stiffness for 5-10 minutes.   Patient Reports nocturnal pain.  Difficulty dressing/grooming: Denies Difficulty climbing stairs: Denies Difficulty getting out of chair: Denies Difficulty using hands for taps, buttons, cutlery, and/or writing: Denies   Review of Systems  Constitutional: Positive for fatigue. Negative for night sweats, weight gain and weight loss.  HENT: Negative for mouth sores, trouble swallowing, trouble swallowing, mouth dryness and nose dryness.   Eyes: Negative for pain, redness, visual disturbance and dryness.  Respiratory: Negative for cough, shortness of breath and difficulty breathing.   Cardiovascular: Negative for chest pain,  palpitations, hypertension, irregular heartbeat and swelling in legs/feet.  Gastrointestinal: Negative for abdominal pain, blood in stool, constipation and diarrhea.  Endocrine: Negative for increased urination.  Genitourinary: Negative for pelvic pain and vaginal dryness.  Musculoskeletal: Positive for morning stiffness. Negative for arthralgias, joint pain, joint swelling, myalgias, muscle weakness, muscle tenderness and myalgias.  Skin: Positive for rash. Negative for color change, hair loss, skin tightness, ulcers and sensitivity to sunlight.  Allergic/Immunologic: Negative for susceptible to infections.  Neurological: Negative for dizziness, light-headedness, headaches, memory loss, night sweats and weakness.  Hematological: Positive for bruising/bleeding tendency. Negative for swollen glands.  Psychiatric/Behavioral: Negative for depressed mood, confusion and sleep disturbance. The patient is not nervous/anxious.     PMFS History:  Patient Active Problem List   Diagnosis Date Noted  . Eczema 10/12/2017  . 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 Adverse food reaction 10/05/2017  . Chronic rhinitis 10/05/2017  . Vitamin D deficiency 09/05/2017  . Anemia 09/05/2017    Past Medical History:  Diagnosis Date  . Allergy   . Anemia   . Eczema   . Herniated cervical disc   . History of chicken pox     Family History  Problem Relation Age of Onset  . Prostate cancer Father   . Hyperlipidemia Father   . Eczema Sister   . Kidney disease Maternal Grandmother   . Arthritis Maternal Grandmother   . Arthritis Paternal Grandmother   . Food Allergy Maternal Aunt  tree nuts  . Breast cancer Neg Hx   . Colon cancer Neg Hx   . Stroke Neg Hx   . Heart attack Neg Hx   . Diabetes Neg Hx   . Allergic rhinitis Neg Hx   . Atopy Neg Hx   . Urticaria Neg Hx    Past Surgical History:  Procedure Laterality Date  . 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 She appears well-developed and well-nourished.  HENT:  Head: Normocephalic and atraumatic.  Eyes: Conjunctivae and EOM are normal.  Neck: Normal range of motion.  Cardiovascular: Normal rate, regular rhythm, normal heart sounds and intact distal pulses.  Pulmonary/Chest: Effort normal and breath sounds normal.  Abdominal: Soft. Bowel sounds are normal.  Lymphadenopathy:    She has no cervical adenopathy.  Neurological: She is alert and oriented to person, place, and time.  Skin: Skin is warm and dry. Capillary refill takes less than 2 seconds.  She has hyperpigmented lesions around her mouth and her forehead.  She also had a scattered hyperpigmented lesions on her back.  She has hyperpigmented lesions on her breast which are in the hair follicles.  Psychiatric: She has a normal mood and affect. Her behavior is normal.  Nursing note and vitals reviewed.    Musculoskeletal Exam: C-spine thoracic lumbar spine good range of motion.  Shoulder joints elbow joints wrist joint MCPs PIPs DIPs were in good range of motion with no synovitis.  Hip joints knee joints ankles MTPs PIPs DIPs were in good range of motion with no synovitis.  CDAI Exam: No CDAI exam completed.    Investigation: Findings:  10/02/17: ANA positive, CRP 10.8, Sed rate 40  09/04/17: TSH 1.15, HgbA1c 5.8   Component     Latest Ref Rng & Units 10/02/2017  Anti Nuclear Antibody(ANA)     Negative Positive (A)  CRP     0.0 - 4.9 mg/L 10.8 (H)  Sed Rate     0 - 32 mm/hr 40 (H)   CBC Latest Ref Rng & Units 09/04/2017 01/01/2016  WBC 4.0 - 10.5 K/uL 8.6 9.4  Hemoglobin 12.0 - 15.0 g/dL 11.2(L) 9.9(A)  Hematocrit  36.0 - 46.0 % 34.7(L) 36  Platelets 150.0 - 400.0 K/uL 323.0 328   CMP Latest Ref Rng & Units 09/04/2017 01/01/2016  Glucose 70 - 99 mg/dL 82 -  BUN 6 - 23 mg/dL 6 6  Creatinine 2.13 - 1.20 mg/dL 0.86 1.0  Sodium 578 - 145 mEq/L 136 135(A)  Potassium 3.5 - 5.1 mEq/L 4.4 4.1  Chloride 96 - 112 mEq/L 102 -  CO2 19 - 32 mEq/L 27 -  Calcium 8.4 - 10.5 mg/dL 9.4 -  Total Protein 6.0 - 8.3 g/dL 7.9 -  Total Bilirubin 0.2 - 1.2 mg/dL 0.4 -  Alkaline Phos 39 - 117 U/L 72 72  AST 0 - 37 U/L 12 15  ALT 0 - 35 U/L 11 9     Imaging: No results found.  Speciality Comments: No specialty comments available.    Procedures:  No  procedures performed Allergies: Aspirin; Ceftriaxone; Codeine; and Sulfa antibiotics   Assessment / Plan:     Visit Diagnoses: Rash and other nonspecific skin eruption - referral to dermatology was placed by her doctor.  She has hyperpigmented lesions on her skin mostly involving her trunk, face and extremities.  Some of these lesions are in the hair follicles as well.  I am uncertain of the etiology of this rash.  She will have to see dermatologist to establish the diagnosis.  Positive ANA (antinuclear antibody) - 10/02/17: ANA positive, CRP 10.8, Sed rate 40.  She has no clinical features of lupus on examination.  I would like to see the skin biopsy results.  I will obtain AVISE labs today.  Onychomycosis - referral to podiatry  Eczema, unspecified type  Vitamin D deficiency  History of anemia  Reactive airways dysfunction syndrome (HCC)    Orders: No orders of the defined types were placed in this encounter.  No orders of the defined types were placed in this encounter.   Face-to-face time spent with patient was 40 minutes. .>50% of time was spent in counseling and coordination of care.  Follow-Up Instructions: Return for Rash, positive ANA.   Pollyann SavoyShaili Jaunita Mikels, MD  Note - This record has been created using Animal nutritionistDragon software.  Chart creation errors have  been sought, but may not always  have been located. Such creation errors do not reflect on  the standard of medical care.

## 2017-11-23 ENCOUNTER — Encounter: Payer: Self-pay | Admitting: Rheumatology

## 2017-11-23 ENCOUNTER — Ambulatory Visit: Payer: No Typology Code available for payment source | Admitting: Rheumatology

## 2017-11-23 VITALS — BP 125/80 | HR 85 | Resp 16 | Ht 65.5 in | Wt 306.0 lb

## 2017-11-23 DIAGNOSIS — L309 Dermatitis, unspecified: Secondary | ICD-10-CM | POA: Diagnosis not present

## 2017-11-23 DIAGNOSIS — B351 Tinea unguium: Secondary | ICD-10-CM | POA: Diagnosis not present

## 2017-11-23 DIAGNOSIS — R21 Rash and other nonspecific skin eruption: Secondary | ICD-10-CM | POA: Diagnosis not present

## 2017-11-23 DIAGNOSIS — E559 Vitamin D deficiency, unspecified: Secondary | ICD-10-CM

## 2017-11-23 DIAGNOSIS — R768 Other specified abnormal immunological findings in serum: Secondary | ICD-10-CM

## 2017-11-23 DIAGNOSIS — J683 Other acute and subacute respiratory conditions due to chemicals, gases, fumes and vapors: Secondary | ICD-10-CM | POA: Diagnosis not present

## 2017-11-23 DIAGNOSIS — Z862 Personal history of diseases of the blood and blood-forming organs and certain disorders involving the immune mechanism: Secondary | ICD-10-CM | POA: Diagnosis not present

## 2017-11-27 ENCOUNTER — Ambulatory Visit: Payer: No Typology Code available for payment source | Admitting: Podiatry

## 2017-11-27 DIAGNOSIS — B351 Tinea unguium: Secondary | ICD-10-CM | POA: Diagnosis not present

## 2017-11-27 MED ORDER — TERBINAFINE HCL 250 MG PO TABS: 250.0000 mg | ORAL_TABLET | Freq: Every day | ORAL | 0 refills | Status: DC

## 2017-12-03 NOTE — Progress Notes (Signed)
   Subjective: 32 year old female presenting today as a new patient with a chief complaint of thick, painful, discolored nails of the 1st and 5th digits of bilateral feet that began several months ago. She has not done anything to treat the symptoms. She denies any modifying factors. Patient is here for further evaluation and treatment.   Past Medical History:  Diagnosis Date  . Allergy   . Anemia   . Eczema   . Herniated cervical disc   . History of chicken pox     Objective: Physical Exam General: The patient is alert and oriented x3 in no acute distress.  Dermatology: Hyperkeratotic, discolored, thickened, onychodystrophy of nails 1 and 5 noted bilaterally. Skin is warm, dry and supple bilateral lower extremities. Negative for open lesions or macerations.  Vascular: Palpable pedal pulses bilaterally. No edema or erythema noted. Capillary refill within normal limits.  Neurological: Epicritic and protective threshold grossly intact bilaterally.   Musculoskeletal Exam: Range of motion within normal limits to all pedal and ankle joints bilateral. Muscle strength 5/5 in all groups bilateral.   Assessment: #1 onychomycosis bilateral 1st and 5th toenails   Plan of Care:  #1 Patient was evaluated. #2 Prescription for Lamisil 250 mg #90 provided to patient.  #3 Discussed laser treatment. Patient may call in and set up appointment if she can afford it.  #4 Return to clinic in 6 months.    Felecia ShellingBrent M. Evans, DPM [facility]  Dr. Felecia ShellingBrent M. Evans, DPM    [address]                                        [place], [place] [address]                Office [phone]  Fax [phone]

## 2017-12-05 MED FILL — TERBINAFINE HCL 250 MG TAB: 250 | 90 days supply | Qty: 90 | Fill #0

## 2017-12-08 DIAGNOSIS — B351 Tinea unguium: Secondary | ICD-10-CM | POA: Insufficient documentation

## 2017-12-08 NOTE — Progress Notes (Deleted)
Office Visit Note  Patient: Patty Bates             Date of Birth: 1986/03/19           MRN: [id number]             PCP: Jarold MottoWorley, Samantha, PA Referring: Jarold MottoWorley, Samantha, PA Visit Date: 12/21/2017 Occupation: @GUAROCC @  Subjective:  No chief complaint on file.   History of Present Illness: Patty Bates is a 32 y.o. female ***   Activities of Daily Living:  Patient reports morning stiffness for *** {minute/hour:19697}.   Patient {ACTIONS;DENIES/REPORTS:21021675::"Denies"} nocturnal pain.  Difficulty dressing/grooming: {ACTIONS;DENIES/REPORTS:21021675::"Denies"} Difficulty climbing stairs: {ACTIONS;DENIES/REPORTS:21021675::"Denies"} Difficulty getting out of chair: {ACTIONS;DENIES/REPORTS:21021675::"Denies"} Difficulty using hands for taps, buttons, cutlery, and/or writing: {ACTIONS;DENIES/REPORTS:21021675::"Denies"}  No Rheumatology ROS completed.   PMFS History:  Patient Active Problem List   Diagnosis Date Noted  . Eczema 10/12/2017  . 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 Adverse food reaction 10/05/2017  . Chronic rhinitis 10/05/2017  . Vitamin D deficiency 09/05/2017  . Anemia 09/05/2017    Past Medical History:  Diagnosis Date  . Allergy   . Anemia   . Eczema   . Herniated cervical disc   . History of chicken pox     Family History  Problem Relation Age of Onset  . Prostate cancer Father   . Hyperlipidemia Father   . Eczema Sister   . Kidney disease Maternal Grandmother   . Arthritis Maternal Grandmother   . Arthritis Paternal Grandmother   . Food Allergy Maternal Aunt        tree nuts  . Breast cancer Neg Hx   . Colon cancer Neg Hx   . Stroke Neg Hx   . Heart attack Neg Hx   . Diabetes Neg Hx   . Allergic rhinitis Neg Hx   . Atopy Neg Hx   . Urticaria Neg Hx    Past Surgical History:  Procedure Laterality Date  . 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   Physical Exam   Musculoskeletal Exam: ***  CDAI Exam: No CDAI exam completed.   Investigation: Findings:  07/26/2017 AVISE -0.5, + ANA   Imaging: No results found.  Recent Labs: Lab Results  Component Value Date   WBC 8.6 09/04/2017   HGB 11.2 (L) 09/04/2017   PLT 323.0 09/04/2017   NA 136 09/04/2017   K 4.4 09/04/2017   CL 102 09/04/2017   CO2 27 09/04/2017   GLUCOSE 82 09/04/2017   BUN 6 09/04/2017   CREATININE 0.73 09/04/2017   BILITOT 0.4 09/04/2017   ALKPHOS 72 09/04/2017   AST 12 09/04/2017   ALT 11 09/04/2017   PROT 7.9 09/04/2017   ALBUMIN 3.9 09/04/2017   CALCIUM 9.4 09/04/2017    Speciality Comments: No specialty comments available.  Procedures:  No procedures performed Allergies: Aspirin; Ceftriaxone; Codeine; and Sulfa antibiotics   Assessment / Plan:     Visit Diagnoses: No diagnosis found.   Orders: No orders of the defined types were placed in this encounter.  No orders of the defined types were placed in this encounter.   Face-to-face time spent with patient was *** minutes. Greater than 50% of time was spent in counseling and coordination of care.  Follow-Up Instructions:  No follow-ups on file.   Bo Merino, MD  Note - This record has been created using Editor, commissioning.  Chart creation errors have been sought, but may not always  have been located. Such creation errors do not reflect on  the standard of medical care.

## 2017-12-15 ENCOUNTER — Telehealth: Payer: Self-pay | Admitting: Rheumatology

## 2017-12-15 NOTE — Telephone Encounter (Signed)
Patient calling in reference to lab results. Per patient is results are negative she can cancel her fu appt. Please call to advise.

## 2017-12-18 NOTE — Progress Notes (Signed)
Office Visit Note  Patient: Patty Bates             Date of Birth: [birth year]           MRN: [id number]             PCP: Inda Coke, PA Referring: Inda Coke, PA Visit Date: 12/20/2017 Occupation: _0 @  Subjective:  Rash.   History of Present Illness: Patty Bates is a 32 y.o. female with history of rash.  She states she is a still waiting for appointment to be scheduled with the dermatologist.  She continues to have rash on her face on her chest.  She denies any joint pain or joint discomfort.  There is no history of joint swelling.  She was seen by podiatrist for the onychomycosis.  She was placed on Lamisil which she started taking.  Activities of Daily Living:  Patient reports morning stiffness for 0 minute.   Patient Denies nocturnal pain.  Difficulty dressing/grooming: Denies Difficulty climbing stairs: Denies Difficulty getting out of chair: Denies Difficulty using hands for taps, buttons, cutlery, and/or writing: Denies  Review of Systems  Constitutional: Positive for fatigue. Negative for night sweats, weight gain and weight loss.  HENT: Negative for mouth sores, trouble swallowing, trouble swallowing, mouth dryness and nose dryness.   Eyes: Negative for pain, redness, visual disturbance and dryness.  Respiratory: Negative for cough, shortness of breath and difficulty breathing.   Cardiovascular: Negative for chest pain, palpitations, hypertension, irregular heartbeat and swelling in legs/feet.  Gastrointestinal: Negative for blood in stool, constipation and diarrhea.  Endocrine: Negative for increased urination.  Genitourinary: Negative for vaginal dryness.  Musculoskeletal: Negative for arthralgias, joint pain, joint swelling, myalgias, muscle weakness, morning stiffness, muscle tenderness and myalgias.  Skin: Positive for rash. Negative for color change, hair loss, skin tightness, ulcers and sensitivity to sunlight.  Allergic/Immunologic:  Negative for susceptible to infections.  Neurological: Negative for dizziness, memory loss, night sweats and weakness.  Hematological: Negative for swollen glands.  Psychiatric/Behavioral: Negative for depressed mood and sleep disturbance. The patient is not nervous/anxious.     PMFS History:  Patient Active Problem List   Diagnosis Date Noted  . Onychomycosis 12/08/2017  . Eczema 10/12/2017  . Reactive airways dysfunction syndrome ([place]) 10/05/2017  . Adverse food reaction 10/05/2017  . Chronic rhinitis 10/05/2017  . Vitamin D deficiency 09/05/2017  . Anemia 09/05/2017    Past Medical History:  Diagnosis Date  . Allergy   . Anemia   . Eczema   . Herniated cervical disc   . History of chicken pox     Family History  Problem Relation Age of Onset  . Prostate cancer Father   . Hyperlipidemia Father   . Eczema Sister   . Kidney disease Maternal Grandmother   . Arthritis Maternal Grandmother   . Arthritis Paternal Grandmother   . Food Allergy Maternal Aunt        tree nuts  . Breast cancer Neg Hx   . Colon cancer Neg Hx   . Stroke Neg Hx   . Heart attack Neg Hx   . Diabetes Neg Hx   . Allergic rhinitis Neg Hx   . Atopy Neg Hx   . Urticaria Neg Hx    Past Surgical History:  Procedure Laterality Date  . 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 She appears well-developed and well-nourished.  HENT:  Head: Normocephalic and atraumatic.  Eyes: Conjunctivae and EOM are normal.  Neck: Normal range of motion.  Cardiovascular: Normal rate, regular rhythm, normal heart sounds and  intact distal pulses.  Pulmonary/Chest: Effort normal and breath sounds normal.  Abdominal: Soft. Bowel sounds are normal.  Lymphadenopathy:    She has no cervical adenopathy.  Neurological: She is alert and oriented to person, place, and time.  Skin: Skin is warm and dry. Capillary refill takes less than 2 seconds.  Hyperpigmented rash was noted on her face.  She also has some folliculitis on her chest.  Psychiatric: She has a normal mood and affect. Her behavior is normal.  Nursing note and vitals reviewed.    Musculoskeletal Exam: C-spine thoracic lumbar spine good range of motion.  Shoulder joints elbow joints wrist joint MCPs PIPs DIPs been good range of motion with no synovitis.  Hip joints, knee joints, ankles and MTPs PIPs were in good range of motion with no synovitis.   CDAI Exam: No CDAI exam completed.   Investigation: No additional findings.  Imaging: No results found.  Recent Labs: Lab Results  Component Value Date   WBC 8.6 09/04/2017   HGB 11.2 (L) 09/04/2017   PLT 323.0 09/04/2017   NA 136 09/04/2017   K 4.4 09/04/2017   CL 102 09/04/2017   CO2 27 09/04/2017   GLUCOSE 82 09/04/2017   BUN 6 09/04/2017   CREATININE 0.73 09/04/2017   BILITOT 0.4 09/04/2017   ALKPHOS 72 09/04/2017   AST 12 09/04/2017   ALT 11 09/04/2017   PROT 7.9 09/04/2017   ALBUMIN 3.9 09/04/2017   CALCIUM 9.4 09/04/2017  November 23, 2017 AVISE index -0.5, ANA low positive CCP Negative ENA negative, anticardiolipin negative, beta-2 negative, RF negative, anti-CCP negative, anti-carbamylated  protein positive, antithyroglobulin negative  Oct 02, 2017 ESR 40 Speciality Comments: No specialty comments available.  Procedures:  No procedures performed Allergies: Aspirin; Ceftriaxone; Codeine; and Sulfa antibiotics   Assessment / Plan:     Visit Diagnoses: Rash-patient continues to have rash on her face and her chest area.  She states she was referred to a dermatologist she has not heard  back from the dermatologist yet.  I have advised her to contact her PCPs office to get details on it.  She will need evaluation and treatment of her rash.  Positive ANA (antinuclear antibody) - AVISE index -0.5, ENA negative, CB CAP negative.  At this point she does not meet the criteria for lupus.  I have advised her to contact me in case she develops any new symptoms.  Otherwise she will be returning for follow-up on PRN basis.  Reactive airways dysfunction syndrome (HCC)  Vitamin D deficiency  Onychomycosis  Other eczema   Orders: No orders of the defined types were placed in this encounter.  No orders of the defined types were placed in this encounter.   Follow-Up Instructions: Return for rash, +ANA.   Bo Merino, MD  Note - This record has been created using Editor, commissioning.  Chart creation errors have been sought, but may not always  have been located. Such  creation errors do not reflect on  the standard of medical care.

## 2017-12-20 ENCOUNTER — Ambulatory Visit: Payer: No Typology Code available for payment source | Admitting: Rheumatology

## 2017-12-20 ENCOUNTER — Encounter: Payer: Self-pay | Admitting: Rheumatology

## 2017-12-20 VITALS — BP 136/86 | HR 85 | Resp 16 | Ht 65.5 in | Wt 309.0 lb

## 2017-12-20 DIAGNOSIS — J683 Other acute and subacute respiratory conditions due to chemicals, gases, fumes and vapors: Secondary | ICD-10-CM

## 2017-12-20 DIAGNOSIS — E559 Vitamin D deficiency, unspecified: Secondary | ICD-10-CM | POA: Diagnosis not present

## 2017-12-20 DIAGNOSIS — L308 Other specified dermatitis: Secondary | ICD-10-CM

## 2017-12-20 DIAGNOSIS — R768 Other specified abnormal immunological findings in serum: Secondary | ICD-10-CM | POA: Diagnosis not present

## 2017-12-20 DIAGNOSIS — R21 Rash and other nonspecific skin eruption: Secondary | ICD-10-CM

## 2017-12-20 DIAGNOSIS — B351 Tinea unguium: Secondary | ICD-10-CM

## 2017-12-20 NOTE — Telephone Encounter (Signed)
Patient in office to discuss results 12/20/17

## 2017-12-21 ENCOUNTER — Ambulatory Visit: Payer: No Typology Code available for payment source | Admitting: Rheumatology

## 2018-01-08 ENCOUNTER — Ambulatory Visit: Payer: No Typology Code available for payment source | Admitting: Allergy & Immunology

## 2018-01-22 ENCOUNTER — Ambulatory Visit: Payer: Self-pay

## 2018-01-22 ENCOUNTER — Encounter: Payer: Self-pay | Admitting: Allergy & Immunology

## 2018-01-22 ENCOUNTER — Ambulatory Visit: Payer: No Typology Code available for payment source | Admitting: Allergy & Immunology

## 2018-01-22 VITALS — BP 122/78 | HR 75 | Resp 18

## 2018-01-22 DIAGNOSIS — T781XXD Other adverse food reactions, not elsewhere classified, subsequent encounter: Secondary | ICD-10-CM

## 2018-01-22 DIAGNOSIS — B351 Tinea unguium: Secondary | ICD-10-CM

## 2018-01-22 DIAGNOSIS — L309 Dermatitis, unspecified: Secondary | ICD-10-CM

## 2018-01-22 DIAGNOSIS — J683 Other acute and subacute respiratory conditions due to chemicals, gases, fumes and vapors: Secondary | ICD-10-CM

## 2018-01-22 DIAGNOSIS — J301 Allergic rhinitis due to pollen: Secondary | ICD-10-CM | POA: Diagnosis not present

## 2018-01-22 MED ORDER — LEVALBUTEROL TARTRATE 45 MCG/ACT IN AERO: 2.0000 | INHALATION_SPRAY | RESPIRATORY_TRACT | 5 refills | Status: DC | PRN

## 2018-01-22 MED ORDER — BUDESONIDE-FORMOTEROL FUMARATE 80-4.5 MCG/ACT IN AERO: 2.0000 | INHALATION_SPRAY | RESPIRATORY_TRACT | 5 refills | Status: DC

## 2018-01-22 NOTE — Patient Instructions (Addendum)
1. Reactive airways dysfunction syndrome - Your history is most consistent with reactive airway dysfunction syndrome, in which your lungs become hyper-reactive to scents and certain triggers. - Since you are having problems with these reactions more often, we will start you on a daily inhaled medication: Symbicort 80/4.5 two puffs in the morning with a spacer. - We will change your rescue inhaler to Xopenex, which does not contain sulfate. - Spacer sample and demonstration provided. - Daily controller medication(s): Symbicort 80/4.515mcg two puffs once daily - Prior to physical activity: Xopenex 2 puffs 10-15 minutes before physical activity. - Rescue medications: Xopenex 4 puffs every 4-6 hours as needed - Changes during respiratory infections or worsening symptoms: Increase Symbicort 80/4.5 to 2 puffs twice daily for ONE TO TWO WEEKS. - Asthma control goals:  * Full participation in all desired activities (may need albuterol before activity) * Albuterol use two time or less a week on average (not counting use with activity) * Cough interfering with sleep two time or less a month * Oral steroids no more than once a year * No hospitalizations  2. Adverse food reaction (Cottonseed, White Potato and Navy Bean) - Continue to avoid the triggering foods.  - EpiPen is up to date.  3. Seasonal allergies (grasses, weeds, trees) - Continue with an antihistamine needed.    4. Return in about 3 months (around 04/24/2018).   Please inform us of any Emergency Department visits, hospitalizations, or changes in symptoms. Call us before going to the ED for breathing or allergy symptoms since we might be able to fit you in for a sick visit. Feel free to contact us anytime with any questions, problems, or concerns.  It was a pleasure to see you again today!  Websites that have reliable patient information: 1. American Academy of Asthma, Allergy, and Immunology: www.aaaai.org 2. Food Allergy Research and  Education (FARE): foodallergy.org 3. Mothers of Asthmatics: http://www.asthmacommunitynetwork.org 4. American College of Allergy, Asthma, and Immunology: www.acaai.org

## 2018-01-22 NOTE — Progress Notes (Signed)
FOLLOW UP  Date of Service/Encounter:  01/22/18   Assessment:   Reactive airways dysfunction syndrome  Adverse food reaction (white potato, navy bean, cottonseed)  Seasonal rhinitis (grasses, weeds, trees)  Dermatitis - needs a dermatology referral  Positive ANA - cleared by rheumatology   Patty Bates returns for a follow-up appointment.  At her last visit, we diagnosed her with reactive airway dysfunction syndrome.  At the time, we did not pursue a controller medication since this seemed to be rather sporadic.  In the interim she has had more episodes at work.  Therefore, we will advance her treatment to a low-dose Symbicort 2 puffs once daily.  She is also very concerned about the use of a particular respirator at work.  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  She continues to avoid white potato and beans, with improvement in her rash and vague reactions.  Her allergic rhinitis is well controlled with the as needed use of antihistamines.  Plan/Recommendations:   1. Reactive airways dysfunction syndrome - Your history is most consistent with reactive airway dysfunction syndrome, in which your lungs become hyper-reactive to scents and certain triggers. - Since you are having problems with these reactions more often, we will start you on a daily inhaled medication: Symbicort 80/4.5 two puffs in the morning with a spacer. - We will change your rescue inhaler to Xopenex, which does not contain sulfate. - Spacer sample and demonstration provided. - Daily controller medication(s): Symbicort 80/4.76mg two puffs once daily - Prior to physical activity: Xopenex 2 puffs 10-15 minutes before physical activity. - Rescue medications: Xopenex 4 puffs every 4-6 hours as needed - Changes during respiratory infections or worsening symptoms: Increase Symbicort 80/4.5 to 2 puffs twice daily for ONE TO TWO WEEKS. - Asthma control goals:  * Full  participation in all desired activities (may need albuterol before activity) * Albuterol use two time or less a week on average (not counting use with activity) * Cough interfering with sleep two time or less a month * Oral steroids no more than once a year * No hospitalizations  2. Adverse food reaction (Cottonseed, White Potato and Navy Bean) - Continue to avoid the triggering foods.  - EpiPen is up to date.  3. Seasonal allergies (grasses, weeds, trees) - Continue with an antihistamine needed.    4. Return in about 3 months (around 04/24/2018).  Subjective:   Patty FLORESCAis a 32y.o. female presenting today for follow up of  Chief Complaint  Patient presents with  . Follow-up    Patty Bates has a history of the following: Patient Active Problem List   Diagnosis Date Noted  . Seasonal allergic rhinitis due to pollen 01/22/2018  . Onychomycosis 12/08/2017  . Eczema 10/12/2017  . Reactive airways dysfunction syndrome ([place] 10/05/2017  . Adverse food reaction 10/05/2017  . Chronic rhinitis 10/05/2017  . Vitamin D deficiency 09/05/2017  . Anemia 09/05/2017    History obtained from: chart review and patient.  Patty Bates's Primary Care Provider is WInda Coke PUtah     [name] presenting for a follow up visit.  She was last seen as a new patient in May 2019.  At that time, I felt that her symptoms were most consistent with reactive airway dysfunction syndrome.  Her episodes were not very frequent and were controlled with avoidance, so I recommended albuterol as needed.  She has a history  of adverse food reactions, which I felt were most consistent with intolerances.  She did have testing for the entire food panel which was borderline positive to cottonseed, white potato, and navy bean.  Neither of these was very large, however.  We did get an environmental allergy panel which showed sensitizations to grasses, trees, and weeds.  She had a very  unclear history, so we obtained inflammatory markers which showed an increased ESR and CRP.  We did refer her to see rheumatology.  Serum tryptase was normal and an alpha gal panel was normal.  Since the last visit, she has done very well.  She has noticed that with her working more often with patients, she is triggered by patient's perfumes.  She has not been using her albuterol, however, since it contains sulfate.  She tells me today that she reacts to sulfa drugs and is even reacted with thin peeling to certain lotions that contain sodium lauryl sulfate.  She is wondering whether there is another alternative.  She did go to see rheumatology, who cleared her from a lupus perspective.  She is following up with them as needed.  Getting rid of beans and potato out of her diet has helped with the rash, but she would like to see a dermatologist for further work-up.  Asthma/Respiratory Symptom History: She reports that she has had increased problems with exposure to scents. 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 In particular, she has problems with patients who wear perfumes. She is wondering about the inhaler and has not used it since she is avoiding sulfa products.    Food Allergy Symptom History: She continues to avoid beans and potatoes. She stays away from chips, but she stays away from them due to the potatoes. This has helped her to lose weight. Her skin is starting to clear up as well.   Otherwise, there have been no changes to her past medical history, surgical history, family history, or social history.    Review of Systems: a 14-point review of systems is pertinent for what is mentioned in HPI.  Otherwise, all other systems were negative. Constitutional: negative other than that listed in the HPI Eyes: negative other than that listed in the HPI Ears, nose, mouth, throat, and face: negative other than that listed in the HPI Respiratory: negative other  than that listed in the HPI Cardiovascular: negative other than that listed in the HPI Gastrointestinal: negative other than that listed in the HPI Genitourinary: negative other than that listed in the HPI Integument: negative other than that listed in the HPI Hematologic: negative other than that listed in the HPI Musculoskeletal: negative other than that listed in the HPI Neurological: negative other than that listed in the HPI Allergy/Immunologic: negative other than that listed in the HPI    Objective:   Blood pressure 122/78, pulse 75, resp. rate 18, SpO2 99 %. There is no height or weight on file to calculate BMI.   Physical Exam:  General: Alert, interactive, in no acute distress.  Pleasant female. Eyes: No conjunctival injection bilaterally, no discharge on the right, no discharge on the left and no Horner-Trantas dots present. PERRL bilaterally. EOMI without pain. No photophobia.  Ears: Right TM pearly gray with normal light reflex, Left TM pearly gray with normal light reflex, Right TM intact without perforation and Left TM intact without perforation.  Nose/Throat: External nose within normal limits and septum midline. Turbinates edematous and pale with  clear discharge. Posterior oropharynx erythematous without cobblestoning in the posterior oropharynx. Tonsils 2+ without exudates.  Tongue without thrush. Lungs: Clear to auscultation without wheezing, rhonchi or rales. No increased work of breathing. CV: Normal S1/S2. No murmurs. Capillary refill <2 seconds.  Skin: Dry, erythematous, excoriated patches on the upper chest and arms. There are some healing lesions on her face bilaterally. Neuro:   Grossly intact. No focal deficits appreciated. Responsive to questions.  Diagnostic studies:   Spirometry: results normal (FEV1: 2.56/91%, FVC: 2.96/89%, FEV1/FVC: 86%).    Spirometry consistent with normal pattern.   Allergy Studies: none    Salvatore Marvel, MD  [facility]  [facility]

## 2018-01-23 ENCOUNTER — Telehealth: Payer: Self-pay

## 2018-01-23 NOTE — Telephone Encounter (Signed)
It looks like the PCP referred the patient to Dermatologist specialist office at the end of June. I have left a voicemail for the dermatologist to see if the patient ever came in.  Thanks

## 2018-01-23 NOTE — Telephone Encounter (Signed)
-----   Message from Alfonse SpruceJoel Louis Gallagher, MD sent at 01/22/2018  2:46 PM EDT ----- Dermatology referral placed.

## 2018-01-23 NOTE — Telephone Encounter (Signed)
Fax # [phone]

## 2018-01-24 NOTE — Telephone Encounter (Signed)
Referral faxed to their office.

## 2018-01-30 NOTE — Progress Notes (Signed)
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  All other systems are negative  Laser therapy administered to affected nails and tolerated well. All safety precautions were in place.  2nd treatment.  Follow up in 4 weeks     

## 2018-02-01 MED FILL — SYMBICORT 80-4.5 MCG INH: 80-4.5 | 60 days supply | Qty: 10 | Fill #0

## 2018-02-19 ENCOUNTER — Other Ambulatory Visit: Payer: No Typology Code available for payment source

## 2018-02-20 ENCOUNTER — Other Ambulatory Visit: Payer: No Typology Code available for payment source

## 2018-03-02 ENCOUNTER — Telehealth: Payer: Self-pay

## 2018-03-02 ENCOUNTER — Telehealth: Payer: Self-pay | Admitting: *Deleted

## 2018-03-02 DIAGNOSIS — Z124 Encounter for screening for malignant neoplasm of cervix: Secondary | ICD-10-CM

## 2018-03-02 NOTE — Telephone Encounter (Signed)
Copied from CRM [id number]. Topic: Appointment Scheduling - Scheduling Inquiry for Clinic >> Mar 02, 2018 11:37 AM Angela Nevin wrote: Reason for CRM: Pt would like to schedule an appointment for Tdap (states she thinks this is what she needs but is not sure of name).   Can you call her and tell her that her Tdap is good until 2026?

## 2018-03-02 NOTE — Telephone Encounter (Signed)
Spoke to pt told her she is not due for a Tdap her shot is good until 2026. Pt verbalized understanding.

## 2018-03-02 NOTE — Addendum Note (Signed)
Addended by: Jimmye Norman on: 03/02/2018 03:30 PM   Modules accepted: Orders

## 2018-03-02 NOTE — Telephone Encounter (Signed)
Spoke to pt told her there has already been two Dermatology referrals put in for you one from your Allergist and another from [place]. Told pt you will need to contact them. Pt verbalized understanding. Asked pt what she is needing GYN for having a problem or just Gyn care? Pt said just regular care and she would like to see Dr. Pennie Rushing. Asked pt what practice is she at? Pt said [facility]. Told pt okay will put referral in and someone will contact you about scheduling an appointment. Pt verbalized understanding.

## 2018-03-02 NOTE — Telephone Encounter (Signed)
Copied from CRM [id number]. Topic: Referral - Question >> Mar 02, 2018 11:34 AM Angela Nevin wrote: Reason for CRM: Pt is requesting referral to dermatology refaxed and is also inquiring about a possible referral to gyn specialist. Pt is requesting a call back to discuss. Please advise.

## 2018-03-19 ENCOUNTER — Ambulatory Visit: Payer: Self-pay

## 2018-03-19 DIAGNOSIS — B351 Tinea unguium: Secondary | ICD-10-CM

## 2018-03-20 ENCOUNTER — Other Ambulatory Visit: Payer: No Typology Code available for payment source

## 2018-03-21 MED FILL — TRETINOIN 0.025% CREAM: 0.025 | 30 days supply | Qty: 20 | Fill #0

## 2018-03-23 NOTE — Progress Notes (Signed)
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  All other systems are negative  Laser therapy administered to affected nails and tolerated well. All safety precautions were in place.  3rd treatment.  Follow up in 4 weeks     

## 2018-04-12 ENCOUNTER — Ambulatory Visit: Payer: No Typology Code available for payment source | Admitting: Allergy & Immunology

## 2018-04-16 ENCOUNTER — Ambulatory Visit (INDEPENDENT_AMBULATORY_CARE_PROVIDER_SITE_OTHER): Payer: Self-pay

## 2018-04-16 ENCOUNTER — Ambulatory Visit: Payer: No Typology Code available for payment source | Admitting: Allergy & Immunology

## 2018-04-16 ENCOUNTER — Encounter

## 2018-04-16 DIAGNOSIS — B351 Tinea unguium: Secondary | ICD-10-CM

## 2018-04-18 NOTE — Progress Notes (Signed)
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  All other systems are negative  Laser therapy administered to affected nails and tolerated well. All safety precautions were in place.  4th treatment.  Follow up in 4 weeks     

## 2018-05-14 ENCOUNTER — Ambulatory Visit (INDEPENDENT_AMBULATORY_CARE_PROVIDER_SITE_OTHER): Payer: Self-pay

## 2018-05-14 DIAGNOSIS — B351 Tinea unguium: Secondary | ICD-10-CM

## 2018-05-15 NOTE — Progress Notes (Signed)
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  All other systems are negative  Laser therapy administered to affected nails and tolerated well. All safety precautions were in place.  5th treatment.  Follow up in 4 weeks     

## 2018-06-07 ENCOUNTER — Ambulatory Visit: Payer: Self-pay

## 2018-06-07 DIAGNOSIS — B351 Tinea unguium: Secondary | ICD-10-CM

## 2018-06-18 NOTE — Progress Notes (Signed)
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 All safety precautions were in place. 6th treatment.  Follow up in 4 weeks

## 2018-07-09 ENCOUNTER — Other Ambulatory Visit: Payer: No Typology Code available for payment source

## 2018-07-10 ENCOUNTER — Ambulatory Visit: Payer: No Typology Code available for payment source

## 2018-07-10 DIAGNOSIS — B351 Tinea unguium: Secondary | ICD-10-CM

## 2018-07-11 NOTE — Progress Notes (Signed)
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 All safety precautions were in place. Follow up prn

## 2018-10-08 ENCOUNTER — Ambulatory Visit: Payer: No Typology Code available for payment source

## 2018-10-08 ENCOUNTER — Other Ambulatory Visit: Payer: Self-pay

## 2018-10-08 DIAGNOSIS — B351 Tinea unguium: Secondary | ICD-10-CM

## 2018-10-11 NOTE — Progress Notes (Signed)
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 All safety precautions were in place. Follow up prn

## 2018-12-28 MED FILL — IBUPROFEN 800 MG TAB: 800 | 10 days supply | Qty: 30 | Fill #0

## 2018-12-28 MED FILL — CHLORHEXIDINE 0.12% RINSE: 0.12 | 16 days supply | Qty: 473 | Fill #0

## 2019-01-07 ENCOUNTER — Ambulatory Visit: Payer: No Typology Code available for payment source | Admitting: Podiatry

## 2019-01-09 ENCOUNTER — Ambulatory Visit: Payer: No Typology Code available for payment source | Admitting: Podiatry

## 2020-01-24 ENCOUNTER — Telehealth: Payer: Self-pay | Admitting: Allergy & Immunology

## 2020-01-24 NOTE — Telephone Encounter (Signed)
Patient called and needs to know if she should get the [facility] shot at [facility] or with [place] where you can watch her [phone]

## 2020-01-24 NOTE — Telephone Encounter (Signed)
Noted. Sounds good!   Natonya Finstad, MD [facility]  

## 2020-01-24 NOTE — Telephone Encounter (Signed)
Patient stated she had a reaction to the flu shot which caused her to have a rash, fever, itching, as well as elevated blood pressure.Discuss with patient COVID-19 Algorithm. During the reaction to her flu vaccine she was given Epi-pen in 2019 and 2020. Was advise to contact us regarding her past history with vaccine reactions by patient's pharmacist. Please advise on this.

## 2020-01-24 NOTE — Telephone Encounter (Signed)
Patient was advise on seeing Dr. Selena Batten for component testing in [facility] on August the 30 at 1 pm. No antihistamines

## 2020-01-24 NOTE — Telephone Encounter (Signed)
Per Dr Dellis Anes the patient could go on the schedule for Monday 01/27/2020 in [facility] for component testing.

## 2020-01-27 ENCOUNTER — Ambulatory Visit: Payer: No Typology Code available for payment source | Admitting: Allergy & Immunology

## 2020-01-27 ENCOUNTER — Ambulatory Visit: Payer: No Typology Code available for payment source

## 2020-01-27 ENCOUNTER — Other Ambulatory Visit: Payer: Self-pay

## 2020-01-27 ENCOUNTER — Encounter: Payer: Self-pay | Admitting: Allergy & Immunology

## 2020-01-27 VITALS — HR 98 | Resp 19 | Ht 66.0 in | Wt 328.5 lb

## 2020-01-27 DIAGNOSIS — Z23 Encounter for immunization: Secondary | ICD-10-CM | POA: Diagnosis not present

## 2020-01-27 DIAGNOSIS — T50B95D Adverse effect of other viral vaccines, subsequent encounter: Secondary | ICD-10-CM

## 2020-01-27 DIAGNOSIS — T50Z95D Adverse effect of other vaccines and biological substances, subsequent encounter: Secondary | ICD-10-CM

## 2020-01-27 NOTE — Progress Notes (Signed)
FOLLOW UP  Date of Service/Encounter:  01/27/20   Assessment:   Vaccine or biological substance causing adverse effect - with negative testing to the COVID19 vaccine components (aside from a delayed reaction to the triamcinolone acetate 1:1 dilution consistent with possible polysorbate allergy) and tolerance of dose #1 of the Pfizer vaccination  Plan/Recommendations:   1. Vaccines and biological substances causing adverse effect - Testing today was negative, including the challenge to the Miralax (PEG). - There is currently no definite skin testing available for the Pfizer and Moderna COVID vaccines and data is limited however, there has been a concern regarding sensitivity to PEG in patients who had anaphylactic reactions to the COVID-19 vaccine. - Skin testing was negative to Miralax (source of PEG 3350), methylprednisolone acetate (source of PEG 3350), and triamcinolone acetonide (source of polysorbate-80). - You tolerated the Pfzier vaccine challenge in the clinic today. - Make an appointment on your way out for your second Pfizer vaccine in three weeks.   2. Return in about 3 weeks (around 02/17/2020) for a regular Office Visit and [employer] #2.    Subjective:   Patty Bates is a 34 y.o. female presenting today for follow up of  Chief Complaint  Patient presents with  . Allergy Testing    Patty Bates has a history of the following: Patient Active Problem List   Diagnosis Date Noted  . Seasonal allergic rhinitis due to pollen 01/22/2018  . Onychomycosis 12/08/2017  . Eczema 10/12/2017  . 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 Adverse food reaction 10/05/2017  . Chronic rhinitis 10/05/2017  . Vitamin D deficiency 09/05/2017  . Anemia 09/05/2017    History obtained from: chart review and patient.  Patty Bates is a 34 y.o. female presenting for skin testing. I last saw her in August 2019. At that time, we diagnosed her with RADS. We started her on  Symbicort 80/4.5 mcg two puffs BID as well as Xopenex PRN. For her history of anaphylaxis to food, we recommended continued avoidance and updated her EpiPen. She has a history of seasonal allergic rhinitis as well with sensitization to grasses, weeds, and trees; we recommended continuing with the antihistamine daily as needed.   Since the last visit, she has done well. She never really followed up because she felt that her symptoms were better controlled since she knew her allergic triggers and how to manage them.   She had a reaction to the flu shot. She last got it in 2020. You got it on the Friday. She felt "horrible" Friday night with a fever and large hot know on her arm. She reports nausea and difficulty breathing. She took an EpiPen and took Benadryl and woke up fine the next day. Therefore, she wants to undergo testing to see if she can tolerate the COVID19 vaccine. She works for [employer] and with the vaccine mandate, she needed to get this addressed. She is very motivated to get the vaccine and if she passes the testing today, she would like to go ahead and get dose #1. She prefers to get [employer] vaccine.   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 She really seems to enjoy her job.   Otherwise, there have been no changes to her past medical history, surgical history, family history, or social history.    Review of Systems  Constitutional: Negative.  Negative for chills, fever, malaise/fatigue and weight loss.  HENT: Negative.  Negative  for congestion, ear discharge, ear pain and sore throat.   Eyes: Negative for pain, discharge and redness.  Respiratory: Negative for cough, sputum production, shortness of breath and wheezing.   Cardiovascular: Negative.  Negative for chest pain and palpitations.  Gastrointestinal: Negative for abdominal pain, constipation, diarrhea, heartburn, nausea and vomiting.  Skin: Negative.  Negative  for itching and rash.  Neurological: Negative for dizziness and headaches.  Endo/Heme/Allergies: Negative for environmental allergies. Does not bruise/bleed easily.       Objective:   Pulse 98, resp. rate 19, height 5\' 6"  (1.676 m), weight (!) 328 lb 8 oz (149 kg), SpO2 97 %. Body mass index is 53.02 kg/m.   Physical Exam: deferred since this was a skin testing and challenge appointment only    Diagnostic studies:   Allergy Studies:   SKIN TESTING (CPT 95018)  SKIN PRICK TESTING ARM #1 SIZE TIME  1. Histamine (1.8mg /mL) 2+ See scanned flowsheet  2. Control (negative - HSA) Negative See scanned flowsheet  3. Triamcinolone (40mg /mL) Negative See scanned flowsheet  4. Methylprednisolone (40mg /mL) Negative See scanned flowsheet  WAIT 15 MINUTES  5. Miralax (1:100 or 1.7mg /mL) Negative See scanned flowsheet  WAIT 15 MINUTES  6. Miralax (1:10 or 17mg /mL) Negative See scanned flowsheet  WAIT 15 MINUTES  7. Miralax (1:1 or 170mg /mL) Negative See scanned flowsheet  WAIT 15 MINUTES   PUNCTURE TEST TOTAL 7    INTRADERMAL TESTING ARM #2 SIZE TIME  1. Control (negative - HSA) Negative See scanned flowsheet  2. Triamcinolone (1:100) Negative See scanned flowsheet  3. Methylprednisolone (1:100) Negative See scanned flowsheet  WAIT 15 MINUTES  4. Triamcinolone (1:10) Negative See scanned flowsheet  5. Methylprednisolone (1:10) Negative See scanned flowsheet  WAIT 15 MINUTES  6. Triamcinolone (1:1) Negative See scanned flowsheet  WAIT 15 MINUTES   INTRADERMAL TEST TOTAL 6    ORAL CHALLENGE (CPT 95076)  DOSE Pre-Vitals (BP/HR/Resp) TIME GIVEN  1. Miralax 170mg /mL suspension 0.72mL  Within normal limits See scanned flow sheet   See scanned flow sheet   WAIT 20 MINUTES  2. Miralax 170mg /mL suspension 66mL  Within normal limits See scanned flow sheet   See scanned flow sheet   WAIT 20 MINUTES  3. Miralax 170mg /mL suspension 2mL  Within normal limits See scanned  flow sheet   See scanned flow sheet   WAIT 30-60 MINUTES (To be determined by the provider)   Post-Vitals (BP/HR/Resp)  Within normal limits See scanned flow sheet END TIME  See scanned flow sheet     Total Time      Allergy testing results were read and interpreted by myself, documented by clinical staff.   Following the testing and challenge, we made the decision to proceed with the vaccination challenge. She received 0.1 mL of the Pfizer vaccination and was monitored for 15 minutes. Then she received 0.2 mL of the Pfizer vaccine and was monitored for 30 minutes. She tolerated this without adverse event.      , MD  [facility]

## 2020-01-27 NOTE — Patient Instructions (Addendum)
1. Vaccines and biological substances causing adverse effect - Testing today was negative, including the challenge to the Miralax (PEG). - There is currently no definite skin testing available for the Pfizer and Moderna COVID vaccines and data is limited however, there has been a concern regarding sensitivity to PEG in patients who had anaphylactic reactions to the COVID-19 vaccine. - Skin testing was negative to Miralax (source of PEG 3350), methylprednisolone acetate (source of PEG 3350), and triamcinolone acetonide (source of polysorbate-80). - You tolerated the Pfzier vaccine challenge in the clinic today. - Make an appointment on your way out for your second Pfizer vaccine in three weeks.   2. Return in about 3 weeks (around 02/17/2020) for a regular Office Visit and [employer] #2.     Please inform us of any Emergency Department visits, hospitalizations, or changes in symptoms. Call us before going to the ED for breathing or allergy symptoms since we might be able to fit you in for a sick visit. Feel free to contact us anytime with any questions, problems, or concerns.  It was a pleasure to see you again today!  Websites that have reliable patient information: 1. American Academy of Asthma, Allergy, and Immunology: www.aaaai.org 2. Food Allergy Research and Education (FARE): foodallergy.org 3. Mothers of Asthmatics: http://www.asthmacommunitynetwork.org 4. American College of Allergy, Asthma, and Immunology: www.acaai.org   COVID-19 Vaccine Information can be found at: PodExchange.nl For questions related to vaccine distribution or appointments, please email [email or web address] or call [phone].     "Like" [place] on Facebook and Instagram for our latest updates!        Make sure you are registered to vote! If you have moved or changed any of your contact information, you will need to get this updated before  voting!  In some cases, you MAY be able to register to vote online: [email or web address]

## 2020-01-29 NOTE — Progress Notes (Signed)
   Covid-19 Vaccination Clinic  Name:  Patty Bates    MRN: [id number] DOB: [birth year]  01/29/2020  [name] was observed post Covid-19 immunization for 30 minutes based on pre-vaccination screening without incident. She was provided with Vaccine Information Sheet and instruction to access the V-Safe system.   [name] was instructed to call 911 with any severe reactions post vaccine: Marland Kitchen Difficulty breathing  . Swelling of face and throat  . A fast heartbeat  . A bad rash all over body  . Dizziness and weakness

## 2020-02-06 ENCOUNTER — Other Ambulatory Visit: Payer: Self-pay

## 2020-02-17 ENCOUNTER — Encounter: Payer: Self-pay | Admitting: Allergy & Immunology

## 2020-02-17 ENCOUNTER — Ambulatory Visit: Payer: No Typology Code available for payment source

## 2020-02-17 ENCOUNTER — Other Ambulatory Visit: Payer: Self-pay

## 2020-02-17 ENCOUNTER — Ambulatory Visit: Payer: No Typology Code available for payment source | Admitting: Allergy & Immunology

## 2020-02-17 VITALS — BP 132/80 | HR 97 | Resp 19

## 2020-02-17 DIAGNOSIS — T781XXD Other adverse food reactions, not elsewhere classified, subsequent encounter: Secondary | ICD-10-CM | POA: Diagnosis not present

## 2020-02-17 DIAGNOSIS — Z23 Encounter for immunization: Secondary | ICD-10-CM

## 2020-02-17 DIAGNOSIS — T7800XD Anaphylactic reaction due to unspecified food, subsequent encounter: Secondary | ICD-10-CM

## 2020-02-17 DIAGNOSIS — J301 Allergic rhinitis due to pollen: Secondary | ICD-10-CM

## 2020-02-17 DIAGNOSIS — J4520 Mild intermittent asthma, uncomplicated: Secondary | ICD-10-CM

## 2020-02-17 MED ORDER — EPINEPHRINE 0.3 MG/0.3ML IJ SOAJ
0.3000 mg | INTRAMUSCULAR | 2 refills | Status: AC | PRN
Start: 2020-02-17 — End: ?

## 2020-02-17 MED FILL — EPINEPHRINE 0.3 MG AUTO-INJ: 0.3 | 30 days supply | Qty: 2 | Fill #0

## 2020-02-17 NOTE — Patient Instructions (Addendum)
1. Mild intermittent asthma, uncomplicated - Lung testing looked slightly lower than last time, but since you are largely asymptomatic, I do not think that we need to restart any inhalers at all. - We will recheck the breathing in six months. - We may consider restarting inhalers at that point if needed.   2. Seasonal allergic rhinitis due to pollen (grasses, weeds, trees) - Continue with antihistamines as needed. - There is no need for a nasal steroid at all.   3. 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 - EpiPen refilled today.  4. Adverse food reaction - You tolerated the second dose of the Pfizer vaccine. - Call us with any concerns at all. - The second dose can be more severe symptom wise, so do not be surprised if you feel bad for a few days.   5. Return in about 6 months (around 08/16/2020). Once we check up on that breathing test from today, we can space out to every year for follow up visits.    Please inform us of any Emergency Department visits, hospitalizations, or changes in symptoms. Call us before going to the ED for breathing or allergy symptoms since we might be able to fit you in for a sick visit. Feel free to contact us anytime with any questions, problems, or concerns.  It was a pleasure to see you again today!  Websites that have reliable patient information: 1. American Academy of Asthma, Allergy, and Immunology: www.aaaai.org 2. Food Allergy Research and Education (FARE): foodallergy.org 3. Mothers of Asthmatics: http://www.asthmacommunitynetwork.org 4. American College of Allergy, Asthma, and Immunology: www.acaai.org   COVID-19 Vaccine Information can be found at: PodExchange.nl For questions related to vaccine distribution or appointments, please email [email or web address] or call [phone].     Like [place] on [employer]  and Instagram for our latest updates!        Make sure you are registered to vote! If you have moved or changed any of your contact information, you will need to get this updated before voting!  In some cases, you MAY be able to register to vote online: [email or web address]

## 2020-02-17 NOTE — Progress Notes (Signed)
FOLLOW UP  Date of Service/Encounter:  02/17/20   Assessment:   Reactive airways dysfunction syndrome - seemingly resolved  Adverse food reaction (white potato, navy bean, cottonseed)  Seasonal rhinitis (grasses, weeds, trees)  Positive ANA - cleared by rheumatology  Plan/Recommendations:   1. Mild intermittent asthma, uncomplicated - Lung testing looked slightly lower than last time, but since you are largely asymptomatic, I do not think that we need to restart any inhalers at all. - We will recheck the breathing in six months. - We may consider restarting inhalers at that point if needed.   2. Seasonal allergic rhinitis due to pollen (grasses, weeds, trees) - Continue with antihistamines as needed. - There is no need for a nasal steroid at all.   3. 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 - EpiPen refilled today.  4. Adverse food reaction - You tolerated the second dose of the Pfizer vaccine. - Call us with any concerns at all. - The second dose can be more severe symptom wise, so do not be surprised if you feel bad for a few days.   5. Return in about 6 months (around 08/16/2020). Once we check up on that breathing test from today, we can space out to every year for follow up visits.   Subjective:   Patty Bates is a 34 y.o. female presenting today for follow up of  Chief Complaint  Patient presents with  . Asthma    Patty Bates has a history of the following: Patient Active Problem List   Diagnosis Date Noted  . Seasonal allergic rhinitis due to pollen 01/22/2018  . Onychomycosis 12/08/2017  . Eczema 10/12/2017  . 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 Adverse food reaction 10/05/2017  . Chronic rhinitis 10/05/2017  . Vitamin D deficiency 09/05/2017  . Anemia 09/05/2017    History obtained from: chart review and patient.  Patty Bates is a 34 y.o. female  presenting for a follow up visit. He was last seen in August 2021. At that time, we diagnosed her with reactive airway dysfunction syndrome. We ended up starting Symbicort two puffs twice daily.  For her allergic rhinitis, we continue with an antihistamine as needed.  She had testing that was positive to grasses, weeds, and trees.  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  We recommended continued avoidance and made sure her EpiPen was up-to-date.  In the interim, she came back to clinic due to a concern for a reaction to the COVID-19 vaccine.  She underwent COVID-19 vaccine component testing which was negative and successfully received her first Pfizer vaccination 3 weeks ago.  She presents today for her second vaccination.   She tolerated the last vaccination without a problem. She did have a sore arm, but otherwise was fine.   Asthma/Respiratory Symptom History: She has not used her inhaler in over a year. Patty Bates's asthma has been well controlled. She has not required rescue medication, experienced nocturnal awakenings due to lower respiratory symptoms, nor have activities of daily living been limited. She has required no Emergency Department or Urgent Care visits for her asthma. She has required zero courses of systemic steroids for asthma exacerbations since the last visit. ACT score today is 25, indicating excellent asthma symptom control.   Allergic Rhinitis Symptom History: She remains on antihistamines daily.  She alternates.  She does not use any no sprays.  She  has not needed antibiotics in quite some time.  Food Allergy Symptom History: She avoids all of her triggering foods.  She does need a new EpiPen.  She has not had any accidental exposures.  Otherwise, there have been no changes to her past medical history, surgical history, family history, or social history.    Review of Systems  Constitutional: Negative.  Negative for chills, fever,  malaise/fatigue and weight loss.  HENT: Negative.  Negative for ear discharge, ear pain, sinus pain and sore throat.   Eyes: Negative for pain, discharge and redness.  Respiratory: Negative for cough, sputum production, shortness of breath and wheezing.   Cardiovascular: Negative.  Negative for chest pain and palpitations.  Gastrointestinal: Negative for abdominal pain, constipation, diarrhea, heartburn, nausea and vomiting.  Skin: Negative.  Negative for itching and rash.  Neurological: Negative for dizziness and headaches.  Endo/Heme/Allergies: Negative for environmental allergies. Does not bruise/bleed easily.       Objective:   Blood pressure 132/80, pulse 97, resp. rate 19, SpO2 98 %. There is no height or weight on file to calculate BMI.   Physical Exam:  Physical Exam Constitutional:      Appearance: She is well-developed.     Comments: Obese female.  Very friendly.  HENT:     Head: Normocephalic and atraumatic.     Right Ear: Tympanic membrane, ear canal and external ear normal.     Left Ear: Tympanic membrane, ear canal and external ear normal.     Nose: No nasal deformity, septal deviation, mucosal edema or rhinorrhea.     Right Turbinates: Enlarged and swollen.     Left Turbinates: Enlarged and swollen.     Right Sinus: No maxillary sinus tenderness or frontal sinus tenderness.     Left Sinus: No maxillary sinus tenderness or frontal sinus tenderness.     Mouth/Throat:     Mouth: Mucous membranes are not pale and not dry.     Pharynx: Uvula midline.  Eyes:     General:        Right eye: No discharge.        Left eye: No discharge.     Conjunctiva/sclera: Conjunctivae normal.     Right eye: Right conjunctiva is not injected. No chemosis.    Left eye: Left conjunctiva is not injected. No chemosis.    Pupils: Pupils are equal, round, and reactive to light.  Cardiovascular:     Rate and Rhythm: Normal rate and regular rhythm.     Heart sounds: Normal heart  sounds.  Pulmonary:     Effort: Pulmonary effort is normal. No tachypnea, accessory muscle usage or respiratory distress.     Breath sounds: Normal breath sounds. No wheezing, rhonchi or rales.     Comments: Pleasant. Moving air well in all lung fields.  Chest:     Chest wall: No tenderness.  Lymphadenopathy:     Cervical: No cervical adenopathy.  Skin:    Coloration: Skin is not pale.     Findings: No abrasion, erythema, petechiae or rash. Rash is not papular, urticarial or vesicular.     Comments: No eczematous or urticarial lesions noted.   Neurological:     Mental Status: She is alert.  Psychiatric:        Behavior: Behavior is cooperative.      Diagnostic studies:    Spirometry: results abnormal (FEV1: 1.98/69%, FVC: 2.21/65%, FEV1/FVC: 90%).    Spirometry consistent with possible restrictive disease.   Allergy Studies: none  Salvatore Marvel, MD  [facility]

## 2020-02-17 NOTE — Progress Notes (Signed)
   Covid-19 Vaccination Clinic  Name:  Patty Bates    MRN: [id number] DOB: 02-Jul-1985  02/17/2020  [name] was observed post Covid-19 immunization for 15 minutes without incident. She was provided with Vaccine Information Sheet and instruction to access the V-Safe system.   [name] was instructed to call 911 with any severe reactions post vaccine: Marland Kitchen Difficulty breathing  . Swelling of face and throat  . A fast heartbeat  . A bad rash all over body  . Dizziness and weakness   Immunizations Administered    Name Date Dose VIS Date Route   Pfizer COVID-19 Vaccine 02/17/2020 12:00 PM 0.3 mL 07/24/2018 Intramuscular   Manufacturer: [employer], [employer]   Lot: [id number]   NDC: [address]-9

## 2020-02-20 ENCOUNTER — Emergency Department (HOSPITAL_COMMUNITY): Payer: No Typology Code available for payment source

## 2020-02-20 ENCOUNTER — Telehealth: Payer: Self-pay

## 2020-02-20 ENCOUNTER — Encounter (HOSPITAL_COMMUNITY): Payer: Self-pay

## 2020-02-20 ENCOUNTER — Emergency Department (HOSPITAL_COMMUNITY)
Admission: EM | Admit: 2020-02-20 | Discharge: 2020-02-21 | Disposition: A | Discharge status: Home / Self Care | Payer: No Typology Code available for payment source | Attending: Emergency Medicine | Admitting: Emergency Medicine

## 2020-02-20 ENCOUNTER — Other Ambulatory Visit: Payer: Self-pay

## 2020-02-20 DIAGNOSIS — R0602 Shortness of breath: Secondary | ICD-10-CM | POA: Diagnosis present

## 2020-02-20 DIAGNOSIS — R000 Tachycardia, unspecified: Secondary | ICD-10-CM | POA: Diagnosis not present

## 2020-02-20 DIAGNOSIS — Z20822 Contact with and (suspected) exposure to covid-19: Secondary | ICD-10-CM | POA: Insufficient documentation

## 2020-02-20 DIAGNOSIS — J45909 Unspecified asthma, uncomplicated: Secondary | ICD-10-CM | POA: Insufficient documentation

## 2020-02-20 LAB — CBC WITH DIFFERENTIAL/PLATELET
Abs Immature Granulocytes: 0.03 10*3/uL (ref 0.00–0.07)
Basophils Absolute: 0 10*3/uL (ref 0.0–0.1)
Basophils Relative: 0 %
Eosinophils Absolute: 0 10*3/uL (ref 0.0–0.5)
Eosinophils Relative: 0 %
HCT: 32.3 % — ABNORMAL LOW (ref 36.0–46.0)
Hemoglobin: 9 g/dL — ABNORMAL LOW (ref 12.0–15.0)
Immature Granulocytes: 0 %
Lymphocytes Relative: 20 %
Lymphs Abs: 1.9 10*3/uL (ref 0.7–4.0)
MCH: 18.6 pg — ABNORMAL LOW (ref 26.0–34.0)
MCHC: 27.9 g/dL — ABNORMAL LOW (ref 30.0–36.0)
MCV: 66.7 fL — ABNORMAL LOW (ref 80.0–100.0)
Monocytes Absolute: 0.9 10*3/uL (ref 0.1–1.0)
Monocytes Relative: 10 %
Neutro Abs: 6.8 10*3/uL (ref 1.7–7.7)
Neutrophils Relative %: 70 %
Platelets: 364 10*3/uL (ref 150–400)
RBC: 4.84 MIL/uL (ref 3.87–5.11)
RDW: 20.2 % — ABNORMAL HIGH (ref 11.5–15.5)
WBC: 9.7 10*3/uL (ref 4.0–10.5)
nRBC: 0 % (ref 0.0–0.2)

## 2020-02-20 LAB — I-STAT BETA HCG BLOOD, ED (MC, WL, AP ONLY): I-stat hCG, quantitative: <5 m[IU]/mL (ref <5)

## 2020-02-20 LAB — COMPREHENSIVE METABOLIC PANEL
ALT: 16 U/L (ref 0–44)
AST: 17 U/L (ref 15–41)
Albumin: 3.9 g/dL (ref 3.5–5.0)
Alkaline Phosphatase: 69 U/L (ref 38–126)
Anion gap: 11 (ref 5–15)
BUN: 8 mg/dL (ref 6–20)
CO2: 23 mmol/L (ref 22–32)
Calcium: 9 mg/dL (ref 8.9–10.3)
Chloride: 100 mmol/L (ref 98–111)
Creatinine, Ser: 0.78 mg/dL (ref 0.44–1.00)
GFR calc Af Amer: >60 mL/min (ref >60)
GFR calc non Af Amer: >60 mL/min (ref >60)
Glucose, Bld: 97 mg/dL (ref 70–99)
Potassium: 3.4 mmol/L — ABNORMAL LOW (ref 3.5–5.1)
Sodium: 134 mmol/L — ABNORMAL LOW (ref 135–145)
Total Bilirubin: 0.2 mg/dL — ABNORMAL LOW (ref 0.3–1.2)
Total Protein: 8.6 g/dL — ABNORMAL HIGH (ref 6.5–8.1)

## 2020-02-20 LAB — D-DIMER, QUANTITATIVE: D-Dimer, Quant: 1.95 ug/mL-FEU — ABNORMAL HIGH (ref 0.00–0.50)

## 2020-02-20 MED ORDER — BUDESONIDE-FORMOTEROL FUMARATE 80-4.5 MCG/ACT IN AERO: 2.0000 | INHALATION_SPRAY | RESPIRATORY_TRACT | 2 refills | Status: DC

## 2020-02-20 MED ORDER — IOHEXOL 350 MG/ML SOLN
100.0000 mL | Freq: Once | INTRAVENOUS | Status: AC | PRN
  Administered 2020-02-20: 100 mL via INTRAVENOUS

## 2020-02-20 MED ORDER — IBUPROFEN 200 MG PO TABS
600.0000 mg | ORAL_TABLET | Freq: Once | ORAL | Status: AC
  Administered 2020-02-20: 600 mg via ORAL
  Filled 2020-02-20: qty 3

## 2020-02-20 MED ORDER — LEVALBUTEROL TARTRATE 45 MCG/ACT IN AERO: 2.0000 | INHALATION_SPRAY | RESPIRATORY_TRACT | 1 refills | Status: AC | PRN

## 2020-02-20 MED ORDER — LACTATED RINGERS IV BOLUS
1000.0000 mL | Freq: Once | INTRAVENOUS | Status: AC
  Administered 2020-02-20: 1000 mL via INTRAVENOUS

## 2020-02-20 MED FILL — SYMBICORT 80-4.5 MCG INH: 80-4.5 | 60 days supply | Qty: 10 | Fill #0

## 2020-02-20 MED FILL — LEVALBUTEROL TAR HFA 45MCG: 45 | 16 days supply | Qty: 15 | Fill #0

## 2020-02-20 NOTE — Telephone Encounter (Signed)
Patient called and stated that after her 2nd dose of pfizer on Monday she developed a off and on fever as well as shortness of breath. Patient asked if we could send in her Symbicort and rescue inhaler to see if it helps with the shortness of breath. She also stated that Dr. Dellis Anes sent an epi pen in to the pharmacy however she has not been able to pick it up. Patient is on the way to get the epi pen and the inhalers now. Patient is wondering what else she can do and if she needs to come in an be seen. Please advise.

## 2020-02-20 NOTE — Discharge Instructions (Signed)
Your evaluation in the emergency department today has been reassuring.  You may continue with over-the-counter medications, as needed, for management of your symptoms.  Follow-up with your primary doctor.  Return for new or concerning symptoms.

## 2020-02-20 NOTE — ED Provider Notes (Signed)
11:35 PM Care assumed from [place], [place] at shift change. Plan discussed which includes d/c if CTA negative. In short, patient c/o SOB and DOE after receiving the second dose of her COVID vaccine. No c/o chest pain. Afebrile in the ED with stable vital signs. No hypoxia.  Imaging reviewed which is negative for PE or other cardiopulmonary process. Will discharge with instruction to f/u with her PCP. Return precautions discussed and provided. Patient discharged in stable condition with no unaddressed concerns.  Vitals:   02/20/20 1856 02/20/20 2151 02/20/20 2200 02/20/20 2215  BP: (!) 153/95 (!) 142/86    Pulse: (!) 103 98 (!) 101 91  Resp: 17 15    Temp: 98.9 F (37.2 C) 98.7 F (37.1 C)    TempSrc: Oral     SpO2: 100% 100% 99% 100%  Weight:  (!) 144.2 kg    Height:  5\' 6"  (1.676 m)      CT Angio Chest PE W and/or Wo Contrast  Result Date: 02/20/2020 CLINICAL DATA:  PE suspected, low/intermediate prob, positive D-dimer Fever and shortness of breath. EXAM: CT ANGIOGRAPHY CHEST WITH CONTRAST TECHNIQUE: Multidetector CT imaging of the chest was performed using the standard protocol during bolus administration of intravenous contrast. Multiplanar CT image reconstructions and MIPs were obtained to evaluate the vascular anatomy. CONTRAST:  02/22/2020 OMNIPAQUE IOHEXOL 350 MG/ML SOLN COMPARISON:  Radiograph earlier today. FINDINGS: Cardiovascular: There are no filling defects within the pulmonary arteries to suggest pulmonary embolus. The thoracic aorta is normal in caliber. No aortic dissection. The heart is normal in size. No pericardial effusion. Mediastinum/Nodes: No enlarged mediastinal or hilar lymph nodes. No esophageal wall thickening. No suspicious thyroid nodule. Lungs/Pleura: Lungs are clear lying for motion artifact. No consolidation or focal airspace disease. No pulmonary edema. No pleural effusion. Trachea and central bronchi are patent. Upper Abdomen: No acute or unexpected findings. Suspected  hepatic steatosis. Musculoskeletal: There are no acute or suspicious osseous abnormalities. Review of the MIP images confirms the above findings. IMPRESSION: No pulmonary embolus or acute intrathoracic abnormality. Electronically Signed   By: M.D.   On: 02/20/2020 23:14      02/22/2020, PA-C 02/20/20 2338    2339, MD 02/22/20 1326

## 2020-02-20 NOTE — Telephone Encounter (Signed)
Spoke with Dr. Dellis Anes, he agreed with sending both inhalers in and inform patient that we could have her seen today in our [place] office. I spoke with patient and she stated that while she was getting her inhalers she went ahead to the emergency department to be seen. She went ahead and set up her 6 month follow up since she didn't on Monday. I informed patient that if she is having any problems to give our office a call. Patient verbalized understanding.

## 2020-02-20 NOTE — ED Provider Notes (Signed)
[facility]-EMERGENCY DEPT Provider Note   CSN: [id number] Arrival date & time: 02/20/20  1435     History Chief Complaint  Patient presents with  . Fever  . Shortness of Breath    Patty Bates is a 34 y.o. female.  HPI      Patty Bates is a 34 y.o. female, with a history of asthma, eczema, anemia, presenting to the ED with shortness of breath over the last 3 days, worse with exertion. Also endorses intermittent fever, initially as high as 104 F. Nausea, vomiting, diarrhea. No vomiting today. Patient states symptoms began after receiving her second dose of Covid vaccine. She denies recent travel, history of PE/DVT, recent surgery, recent trauma. She states she went to get a Covid test today, but has not yet gotten the results. Her last dose of antipyretic was ibuprofen at 11 AM this morning. She is currently menstruating. Denies cough, chest pain, abdominal pain, lower extremity edema/pain, rash, or any other complaints.   Past Medical History:  Diagnosis Date  . Allergy   . Anemia   . Asthma   . Eczema   . Herniated cervical disc   . History of chicken pox   . Urticaria     Patient Active Problem List   Diagnosis Date Noted  . Seasonal allergic rhinitis due to pollen 01/22/2018  . Onychomycosis 12/08/2017  . Eczema 10/12/2017  . 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 Adverse food reaction 10/05/2017  . Chronic rhinitis 10/05/2017  . Vitamin D deficiency 09/05/2017  . Anemia 09/05/2017    Past Surgical History:  Procedure Laterality Date  . WISDOM TOOTH EXTRACTION Bilateral 2004     OB History   No obstetric history on file.     Family History  Problem Relation Age of Onset  . Prostate cancer Father   . Hyperlipidemia Father   . Eczema Sister   . Kidney disease Maternal Grandmother   . Arthritis Maternal Grandmother   . Arthritis Paternal Grandmother   . Food Allergy Maternal Aunt        tree nuts    . Breast cancer Neg Hx   . Colon cancer Neg Hx   . Stroke Neg Hx   . Heart attack Neg Hx   . Diabetes Neg Hx   . Allergic rhinitis Neg Hx   . Atopy Neg Hx   . Urticaria Neg Hx     Social History   Tobacco Use  . Smoking status: Never Smoker  . Smokeless tobacco: Never Used  Vaping Use  . Vaping Use: Never used  Substance Use Topics  . Alcohol use: Not Currently    Comment: rarely  . Drug use: Never    Home Medications Prior to Admission medications   Medication Sig Start Date End Date Taking? Authorizing Provider  budesonide-formoterol (SYMBICORT) 80-4.5 MCG/ACT inhaler Inhale 2 puffs into the lungs every morning. Patient taking differently: Inhale 2 puffs into the lungs daily.  02/20/20  Yes Alfonse Spruce, MD  DiphenhydrAMINE HCl (BENADRYL ALLERGY PO) Take 1 tablet by mouth as needed (allergy).    Yes [provider]  EPINEPHrine 0.3 mg/0.3 mL IJ SOAJ injection Inject 0.3 mg into the muscle as needed. Patient taking differently: Inject 0.3 mg into the muscle as needed for anaphylaxis.  02/17/20  Yes Alfonse Spruce, MD  FERROUS GLUCONATE IRON PO Take 1 tablet by mouth daily.    Yes [provider]  Fexofenadine HCl (ALLEGRA ALLERGY  PO) Take 1 tablet by mouth daily.    Yes [provider]  levalbuterol Pauline Aus HFA) 45 MCG/ACT inhaler Inhale 2 puffs into the lungs every 4 (four) hours as needed for wheezing. 02/20/20  Yes Alfonse Spruce, MD  vitamin B-12 (CYANOCOBALAMIN) 100 MCG tablet Take 100 mcg by mouth daily.   Yes [provider]    Allergies    Aspirin, Ceftriaxone, Naproxen, Sulfa antibiotics, Codeine, Other, and Polysorbate  Review of Systems   Review of Systems  Constitutional: Positive for fever.  Respiratory: Positive for shortness of breath. Negative for cough.   Cardiovascular: Negative for chest pain and leg swelling.  Gastrointestinal: Positive for diarrhea, nausea and vomiting. Negative for abdominal  pain.  Genitourinary: Negative for dysuria.  Musculoskeletal: Positive for myalgias.  Neurological: Negative for dizziness, syncope and weakness.  All other systems reviewed and are negative.   Physical Exam Updated Vital Signs BP (!) 153/95 (BP Location: Right Arm)   Pulse (!) 103   Temp 98.9 F (37.2 C) (Oral)   Resp 17   LMP 02/18/2020 (Approximate)   SpO2 100%   Physical Exam Vitals and nursing note reviewed.  Constitutional:      General: She is not in acute distress.    Appearance: She is well-developed. She is not diaphoretic.  HENT:     Head: Normocephalic and atraumatic.     Mouth/Throat:     Mouth: Mucous membranes are moist.     Pharynx: Oropharynx is clear.  Eyes:     Conjunctiva/sclera: Conjunctivae normal.  Cardiovascular:     Rate and Rhythm: Regular rhythm. Tachycardia present.     Pulses: Normal pulses.          Radial pulses are 2+ on the right side and 2+ on the left side.       Posterior tibial pulses are 2+ on the right side and 2+ on the left side.     Heart sounds: Normal heart sounds.     Comments: Tactile temperature in the extremities appropriate and equal bilaterally. Mildly tachycardic. Pulmonary:     Effort: Pulmonary effort is normal. No respiratory distress.     Breath sounds: Normal breath sounds.     Comments: No increased work of breathing. Speaks in full sentences without difficulty. Abdominal:     Palpations: Abdomen is soft.     Tenderness: There is no abdominal tenderness. There is no guarding.  Musculoskeletal:     Cervical back: Neck supple.     Right lower leg: No edema.     Left lower leg: No edema.  Lymphadenopathy:     Cervical: No cervical adenopathy.  Skin:    General: Skin is warm and dry.  Neurological:     Mental Status: She is alert.  Psychiatric:        Mood and Affect: Mood and affect normal.        Speech: Speech normal.        Behavior: Behavior normal.     ED Results / Procedures / Treatments    Labs (all labs ordered are listed, but only abnormal results are displayed) Labs Reviewed  COMPREHENSIVE METABOLIC PANEL - Abnormal; Notable for the following components:      Result Value   Sodium 134 (*)    Potassium 3.4 (*)    Total Protein 8.6 (*)    Total Bilirubin 0.2 (*)    All other components within normal limits  CBC WITH DIFFERENTIAL/PLATELET - Abnormal; Notable for the following  components:   Hemoglobin 9.0 (*)    HCT 32.3 (*)    MCV 66.7 (*)    MCH 18.6 (*)    MCHC 27.9 (*)    RDW 20.2 (*)    All other components within normal limits  D-DIMER, QUANTITATIVE (NOT AT [facility]) - Abnormal; Notable for the following components:   D-Dimer, Quant 1.95 (*)    All other components within normal limits  RESPIRATORY PANEL BY RT PCR (FLU A&B, COVID)  I-STAT BETA HCG BLOOD, ED ([facility], [facility], [facility] ONLY)    EKG EKG Interpretation  Date/Time:  Thursday February 20 2020 14:50:20 EDT Ventricular Rate:  110 PR Interval:  134 QRS Duration: 64 QT Interval:  294 QTC Calculation: 397 R Axis:   99 Text Interpretation: Sinus tachycardia Right atrial enlargement Rightward axis Septal infarct , age undetermined T wave abnormality, consider inferior ischemia Abnormal ECG Confirmed by Marianna Fussykstra, Richard ([id number]) on 02/20/2020 8:23:54 PM   Radiology DG Chest 2 View  Result Date: 02/20/2020 CLINICAL DATA:  Fever, short of breath EXAM: CHEST - 2 VIEW COMPARISON:  None. FINDINGS: The heart size and mediastinal contours are within normal limits. Both lungs are clear. The visualized skeletal structures are unremarkable. IMPRESSION: No active cardiopulmonary disease. Electronically Signed   By: Sharlet SalinaMichael  Brown M.D.   On: 02/20/2020 15:19    Procedures Procedures (including critical care time)  Medications Ordered in ED Medications  lactated ringers bolus 1,000 mL (1,000 mLs Intravenous New Bag/Given 02/20/20 2036)  ibuprofen (ADVIL) tablet 600 mg (600 mg Oral Given 02/20/20 2037)    ED Course  I have  reviewed the triage vital signs and the nursing notes.  Pertinent labs & imaging results that were available during my care of the patient were reviewed by me and considered in my medical decision making (see chart for details).  Clinical Course as of Feb 19 2218  Thu Feb 20, 2020  2151 Patient states her typical hemoglobin level is around 10. She is also currently menstruating.  Hemoglobin(!): 9.0 [SJ]  2153 Patient states she feels much better.  No shortness of breath currently.  We discussed the proposed plan for CT PE study.  If this study is negative, she will be discharged and will follow up with her asthma/pulmonology specialist.  She is in agreement with this plan.   [SJ]    Clinical Course User Index [SJ] Yetzali Weld, Hillard DankerShawn C, PA-C   MDM Rules/Calculators/A&P                          Patient presents primarily complaining of shortness of breath. She is mildly tachycardic. Nontoxic-appearing, afebrile, not hypotensive, not tachypneic, maintains excellent SPO2 on room air, no apparent distress.  I personally reviewed and interpreted the patient's available labs and imaging studies. I do not have results for the patient's Covid test that was done earlier today. Mild hypokalemia. Elevated D-dimer. CT PE study ordered.  Patient does states she recently had an appointment with her asthma and allergy specialist, who stated her PFTs were worse than previous. Patient was started back on Symbicort and Xopenex. She has not yet been able to pick up the Xopenex because the pharmacy did not have it available.  Findings and plan of care discussed with Marianna Fussichard Dykstra, MD.    End of shift patient care handoff report given to [facility] Humes, PA-C. Plan: CT PE study.  If unremarkable, discharge patient with office follow-up.  Final Clinical Impression(s) / ED Diagnoses  Final diagnoses:  Shortness of breath    Rx / DC Orders ED Discharge Orders    None       Concepcion Living 02/20/20  2219    Milagros Loll, MD 02/22/20 1326

## 2020-02-20 NOTE — ED Triage Notes (Signed)
Pt presents with c/o fever and shortness of breath. Pt reports fever since 9/20 and shortness of breath since 9/21. Pt reports highest fever at home was 104. Pt in no acute distress at this time. Pt reports she did test for Covid today and it was negative.

## 2020-02-21 LAB — RESP PANEL BY RT PCR (RSV, FLU A&B, COVID)
Influenza A by PCR: NEGATIVE
Influenza B by PCR: NEGATIVE
Respiratory Syncytial Virus by PCR: NEGATIVE
SARS Coronavirus 2 by RT PCR: NEGATIVE

## 2020-08-12 NOTE — Patient Instructions (Signed)
Asthma Continue Xopenex 2 puffs once every 4-6 hours as needed for cough or wheeze For asthma flare begin Symbicort 80-2 puffs twice a day with a spacer for 2 weeks or until cough and wheeze free  Allergic rhinitis Continue allergen avoidance measures directed toward grass pollen, weed pollen, and tree pollen as listed below Continue Allegra 180 mg once a day as needed for runny nose You may use Flonase 1 to 2 sprays in each nostril once a day as needed for stuffy nose Consider saline nasal rinses as needed for nasal symptoms. Use this before any medicated nasal sprays for best result  Allergic conjunctivitis Some over the counter eye drops include Pataday one drop in each eye once a day as needed for red, itchy eyes OR Zaditor one drop in each eye twice a day as needed for red itchy eyes.  Food allergy Continue to avoid white potato, navy beans, and cottonseed. In case of an allergic reaction, take Benadryl 50 mg every 4 hours, and if life-threatening symptoms occur, inject with EpiPen 0.3 mg.  Call the clinic if this treatment plan is not working well for you  Follow up in 6 months or sooner if needed.  Reducing Pollen Exposure The American Academy of Allergy, Asthma and Immunology suggests the following steps to reduce your exposure to pollen during allergy seasons. 1. Do not hang sheets or clothing out to dry; pollen may collect on these items. 2. Do not mow lawns or spend time around freshly cut grass; mowing stirs up pollen. 3. Keep windows closed at night.  Keep car windows closed while driving. 4. Minimize morning activities outdoors, a time when pollen counts are usually at their highest. 5. Stay indoors as much as possible when pollen counts or humidity is high and on windy days when pollen tends to remain in the air longer. 6. Use air conditioning when possible.  Many air conditioners have filters that trap the pollen spores. 7. Use a HEPA room air filter to remove pollen form  the indoor air you breathe.

## 2020-08-12 NOTE — Progress Notes (Signed)
[name] [place] [address] Dept: [phone]  FOLLOW UP NOTE  Patient ID: Patty Bates, female    DOB: 01/08/1986  Age: 35 y.o. MRN: [id number] Date of Office Visit: 08/13/2020  Assessment  Chief Complaint: Asthma  HPI Patty Bates is a 35 year old female who presents to the clinic for follow-up visit.  Was last seen in this clinic on 02/17/2020 by Dr. Dellis Anes for evaluation of asthma, allergic rhinitis, and food allergy to white potato, navy beans, and cottonseed.  In the interim she received her second Pfizer Covid vaccine on 02/17/2020 and reported on 02/20/2020 feeling symptoms including fever and shortness of breath for which she presented to the emergency department where her CT angiography was negative for PE or acute intrathoracic abnormality.  At that time she continued on Symbicort and albuterol.  At today's visit she reports her asthma has been moderately well controlled and continuously improving.  She reports occasional shortness of breath with activity only.  She does report that wheezing and cough have resolved.  She has last used her Xopenex in October 2021 and last used Symbicort in December 2021.  Allergic rhinitis is reported as well controlled with Allegra 180 mg once a day.  She is not currently using a nasal saline rinse or nasal steroid spray.  She continues to wear an N95 mask while working outside in the yard.  Allergic conjunctivitis is reported as poorly controlled with symptoms including stringy crust occurring in the morning and ocular pruritus.  She reports that she does have an allergy eye drop, however, she has not used this medication at this time.  She does report to discolored areas on her left forearm that she claims appeared after Covid vaccine component testing.  She reports that these areas began as lightened skin, almost like " vitiligo" and now are beginning to get some color back.  She denies any itch or pain associated with these areas.   Her current medications are listed in the chart.   Drug Allergies:  Allergies  Allergen Reactions   Aspirin Shortness Of Breath    palpitations   Ceftriaxone Shortness Of Breath    Hives ,vomiting,diahhrea   Naproxen Hypertension, Nausea And Vomiting, Palpitations, Shortness Of Breath, Swelling and Tinitus   Sulfa Antibiotics Shortness Of Breath    Hives,fever, itching, vomiting   Codeine     Itching, tinnitus, migraine, paranoia   Other Hypertension    Tomatoes, watermelon,pineapple,peaches, vinegar, beans, potatoes,cotton seed oil,nuts ; gets hives, itching, blurred vision,swells mouth, sore throat.   Polysorbate Other (See Comments)    Fever    Physical Exam: BP (!) 142/82 (BP Location: Left Arm, Patient Position: Sitting, Cuff Size: Large)    Pulse 77    Temp 98 F (36.7 C) (Temporal)    Resp 17    SpO2 100%    Physical Exam Vitals reviewed.  Constitutional:      Appearance: Normal appearance.  HENT:     Head: Normocephalic and atraumatic.     Right Ear: Tympanic membrane normal.     Left Ear: Tympanic membrane normal.     Nose:     Comments: Bilateral nares slightly erythematous with clear nasal drainage noted.  Pharynx normal.  Ears normal.  Eyes normal.    Mouth/Throat:     Pharynx: Oropharynx is clear.  Eyes:     Conjunctiva/sclera: Conjunctivae normal.  Cardiovascular:     Rate and Rhythm: Normal rate and regular rhythm.     Heart  sounds: Normal heart sounds. No murmur heard.   Pulmonary:     Effort: Pulmonary effort is normal.     Breath sounds: Normal breath sounds.     Comments: Lungs clear to auscultation Musculoskeletal:        General: Normal range of motion.     Cervical back: Normal range of motion and neck supple.  Skin:    General: Skin is warm and dry.     Comments: 2 hyperpigmented areas on her left forearm.  No open areas or drainage noted.  Neurological:     Mental Status: She is alert and oriented to person, place, and time.   Psychiatric:        Mood and Affect: Mood normal.        Behavior: Behavior normal.        Thought Content: Thought content normal.        Judgment: Judgment normal.     Diagnostics: FVC 3.26, FEV1 2.49.  Predicted FVC 3.42, predicted FEV1 2.88.  Spirometry indicates normal ventilatory function.  Assessment and Plan: 1. Mild intermittent asthma, uncomplicated   2. Seasonal allergic rhinitis due to pollen   3. Anaphylactic shock due to food, subsequent encounter   4. Adverse food reaction, subsequent encounter     Meds ordered this encounter  Medications   budesonide-formoterol (SYMBICORT) 80-4.5 MCG/ACT inhaler    Sig: For asthma flare, begin Symbicort 2 puffs twice a day with a spacer for 2 weeks or until cough and wheeze free.    Dispense:  1 each    Refill:  5    Patient Instructions  Asthma Continue Xopenex 2 puffs once every 4-6 hours as needed for cough or wheeze For asthma flare begin Symbicort 80-2 puffs twice a day with a spacer for 2 weeks or until cough and wheeze free  Allergic rhinitis Continue allergen avoidance measures directed toward grass pollen, weed pollen, and tree pollen as listed below Continue Allegra 180 mg once a day as needed for runny nose You may use Flonase 1 to 2 sprays in each nostril once a day as needed for stuffy nose Consider saline nasal rinses as needed for nasal symptoms. Use this before any medicated nasal sprays for best result  Allergic conjunctivitis Some over the counter eye drops include Pataday one drop in each eye once a day as needed for red, itchy eyes OR Zaditor one drop in each eye twice a day as needed for red itchy eyes.  Food allergy Continue to avoid white potato, navy beans, and cottonseed. In case of an allergic reaction, take Benadryl 50 mg every 4 hours, and if life-threatening symptoms occur, inject with EpiPen 0.3 mg.  Call the clinic if this treatment plan is not working well for you  Follow up in 6 months  or sooner if needed.   Return in about 6 months (around 02/13/2021), or if symptoms worsen or fail to improve.    Thank you for the opportunity to care for this patient.  Please do not hesitate to contact me with questions.  Thermon Leyland, FNP [facility]

## 2020-08-13 ENCOUNTER — Ambulatory Visit: Payer: No Typology Code available for payment source | Admitting: Family Medicine

## 2020-08-13 ENCOUNTER — Other Ambulatory Visit: Payer: Self-pay

## 2020-08-13 ENCOUNTER — Encounter: Payer: Self-pay | Admitting: Family Medicine

## 2020-08-13 ENCOUNTER — Other Ambulatory Visit: Payer: Self-pay | Admitting: Family Medicine

## 2020-08-13 VITALS — BP 142/82 | HR 77 | Temp 98.0°F | Resp 17

## 2020-08-13 DIAGNOSIS — T781XXD Other adverse food reactions, not elsewhere classified, subsequent encounter: Secondary | ICD-10-CM | POA: Diagnosis not present

## 2020-08-13 DIAGNOSIS — T7800XD Anaphylactic reaction due to unspecified food, subsequent encounter: Secondary | ICD-10-CM

## 2020-08-13 DIAGNOSIS — J4520 Mild intermittent asthma, uncomplicated: Secondary | ICD-10-CM | POA: Diagnosis not present

## 2020-08-13 DIAGNOSIS — T7800XA Anaphylactic reaction due to unspecified food, initial encounter: Secondary | ICD-10-CM | POA: Insufficient documentation

## 2020-08-13 DIAGNOSIS — J301 Allergic rhinitis due to pollen: Secondary | ICD-10-CM

## 2020-08-13 MED ORDER — BUDESONIDE-FORMOTEROL FUMARATE 80-4.5 MCG/ACT IN AERO
INHALATION_SPRAY | RESPIRATORY_TRACT | 5 refills | Status: DC
Start: 2020-08-13 — End: 2020-08-13

## 2020-08-20 ENCOUNTER — Ambulatory Visit: Payer: No Typology Code available for payment source | Admitting: Allergy & Immunology

## 2020-12-11 ENCOUNTER — Other Ambulatory Visit (HOSPITAL_COMMUNITY): Payer: Self-pay

## 2020-12-11 MED ORDER — XARELTO 20 MG PO TABS
20.0000 mg | ORAL_TABLET | Freq: Every day | ORAL | 2 refills | Status: AC
  Filled 2020-12-11: qty 30, 30d supply, fill #0

## 2021-01-09 ENCOUNTER — Other Ambulatory Visit (HOSPITAL_COMMUNITY): Payer: Self-pay

## 2021-01-09 MED ORDER — XARELTO 15 MG PO TABS
15.0000 mg | ORAL_TABLET | Freq: Every day | ORAL | 1 refills | Status: AC
  Filled 2021-01-09: qty 30, 30d supply, fill #0
  Filled 2021-02-06: qty 30, 30d supply, fill #1

## 2021-02-06 ENCOUNTER — Other Ambulatory Visit (HOSPITAL_COMMUNITY): Payer: Self-pay

## 2022-02-20 IMAGING — CT CT ANGIO CHEST
2 of 6 series · 18 of 36 positions shown · IV contrast (omnipaque)
Comparison: Radiograph earlier today.

CLINICAL DATA: PE suspected, low/intermediate prob, positive
D-dimer

Fever and shortness of breath.
EXAM:
CT ANGIOGRAPHY CHEST WITH CONTRAST
TECHNIQUE: Multidetector CT imaging of the chest was performed using the
standard protocol during bolus administration of intravenous
contrast. Multiplanar CT image reconstructions and MIPs were
obtained to evaluate the vascular anatomy.
CONTRAST:  100mL OMNIPAQUE IOHEXOL 350 MG/ML SOLN

[Series 5: thins · axial · 0.69mm/px · z∈[+1468,+1706]mm · 17 of 268 slices shown]
[im 15/268  lung]
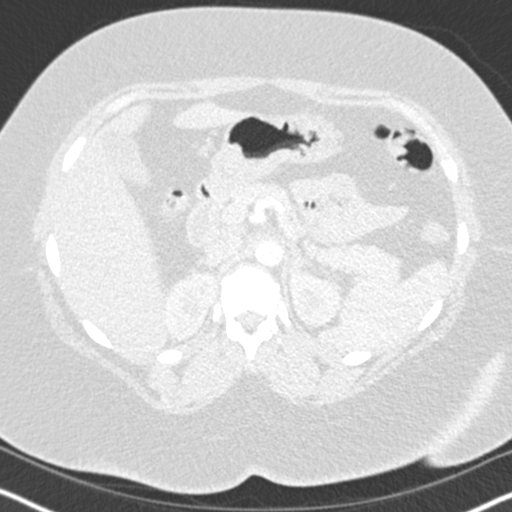
[im 30/268  mediastinal]
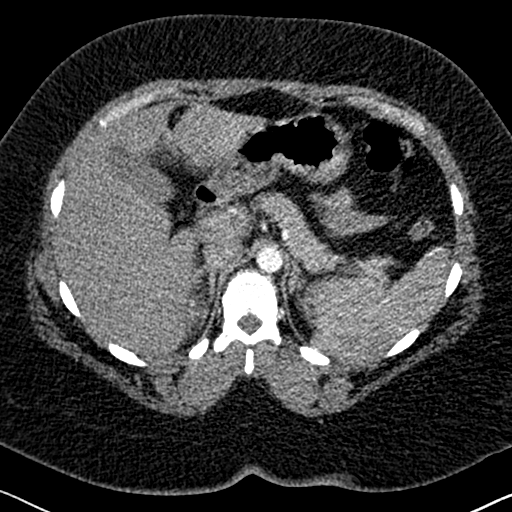
[im 45/268  lung]
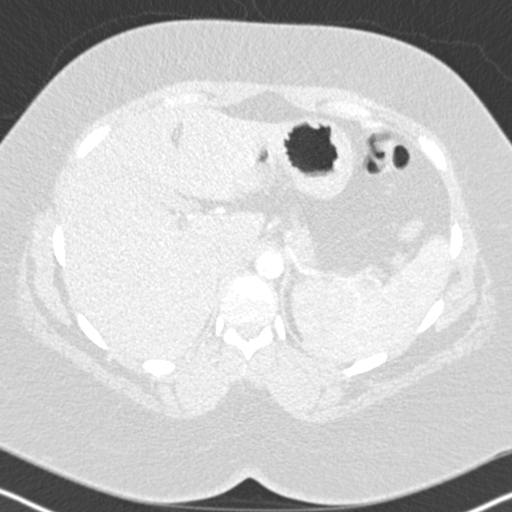
[im 60/268  mediastinal]
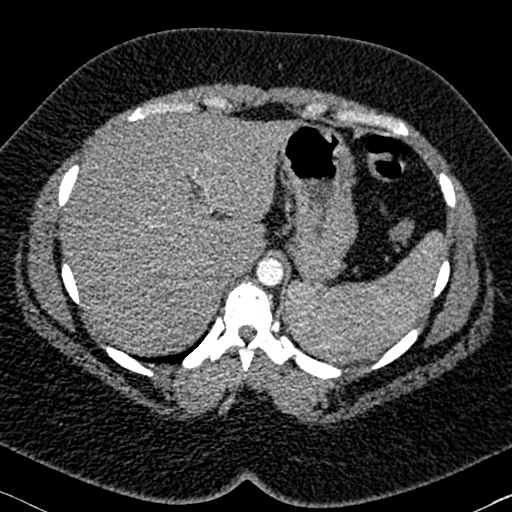
[im 75/268  lung]
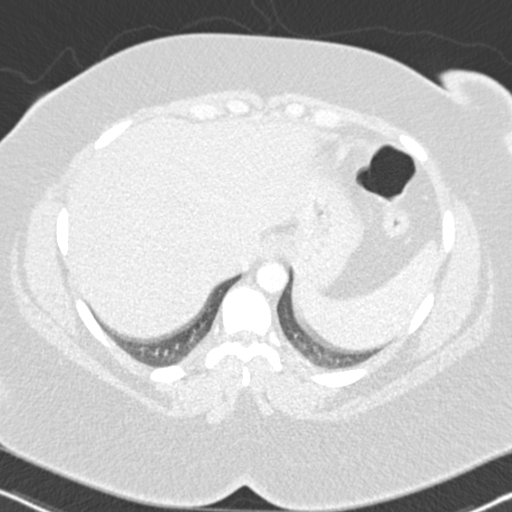
[im 90/268  mediastinal]
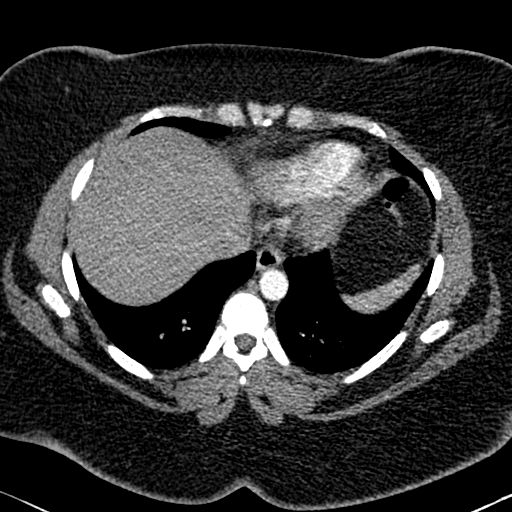
[im 104/268  lung]
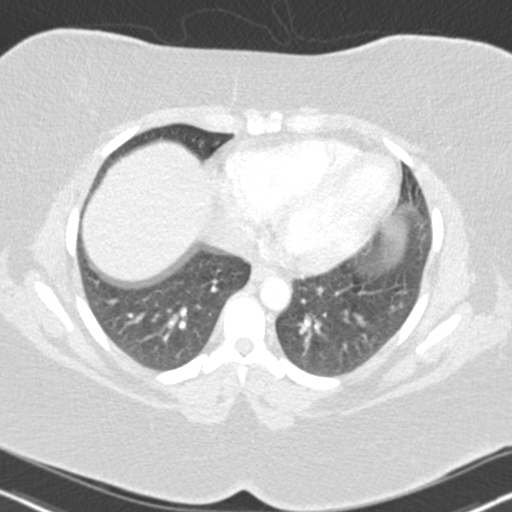
[im 119/268  mediastinal]
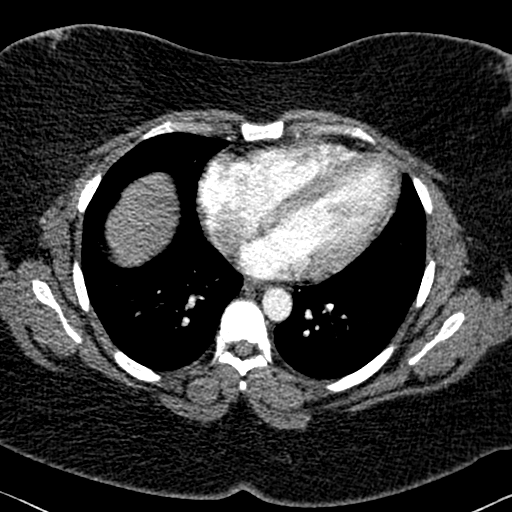
[im 134/268  lung]
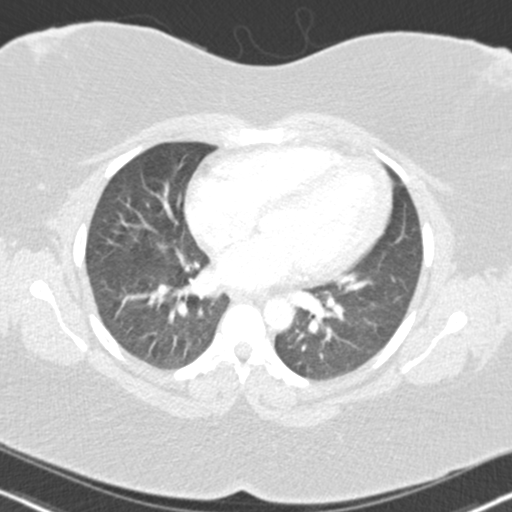
[im 149/268  mediastinal]
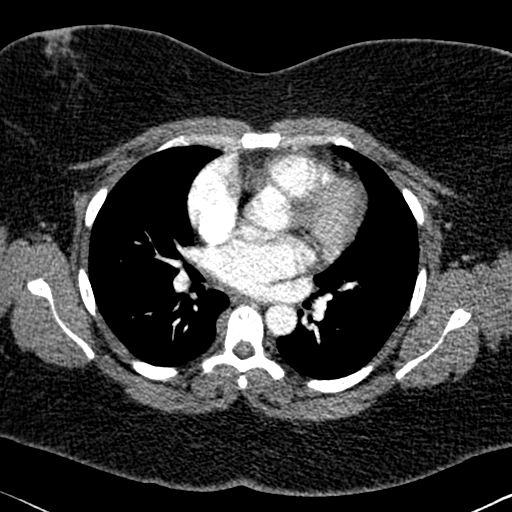
[im 164/268  lung]
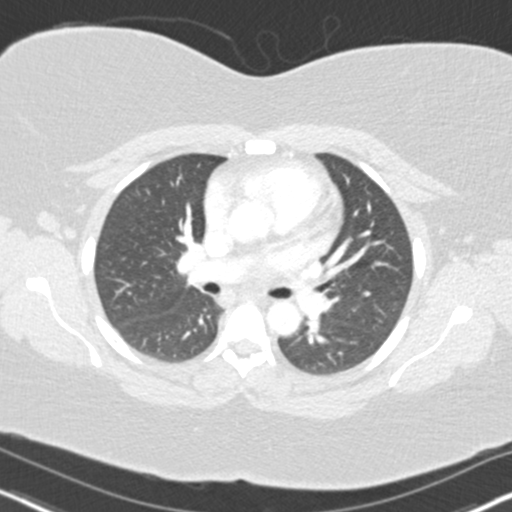
[im 179/268  mediastinal]
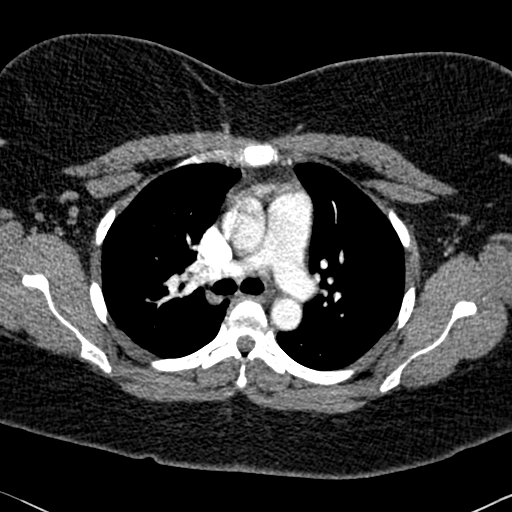
[im 193/268  lung]
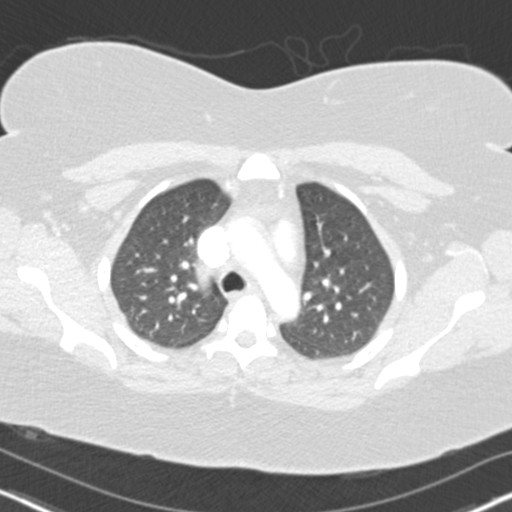
[im 208/268  mediastinal]
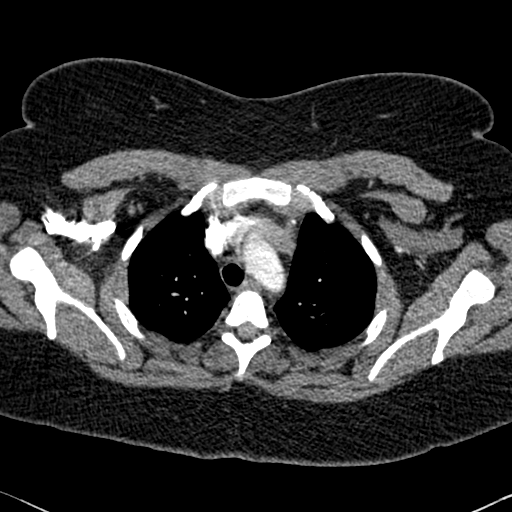
[im 223/268  lung]
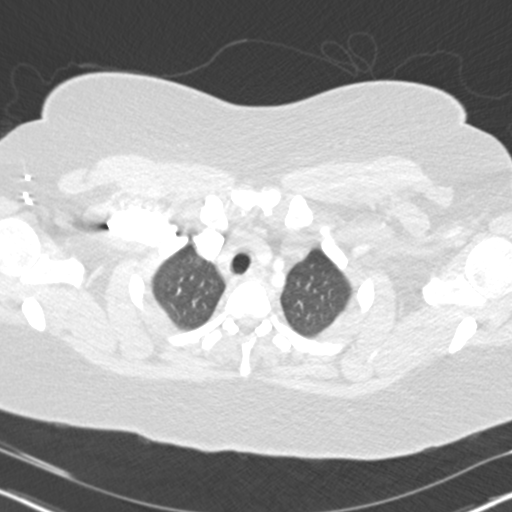
[im 238/268  mediastinal]
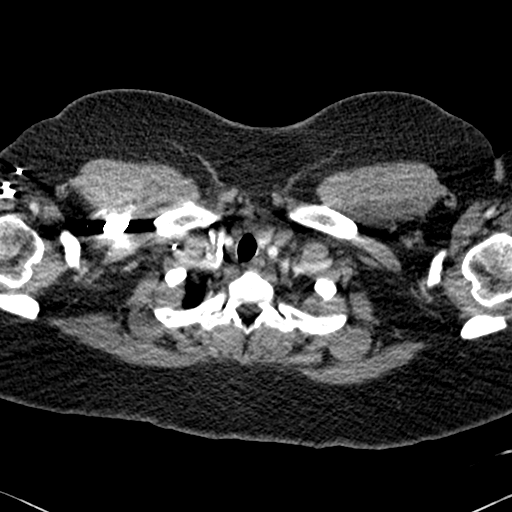
[im 253/268  lung]
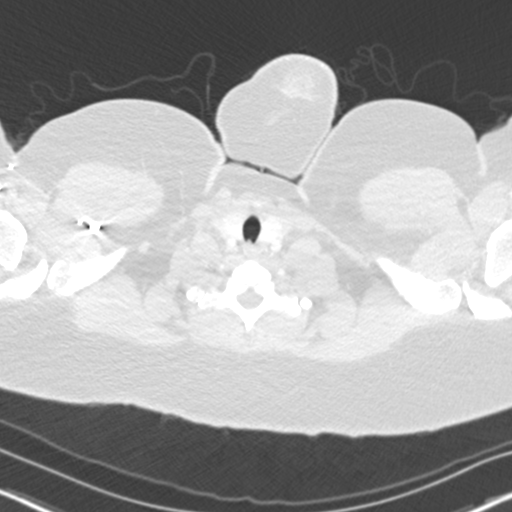

[Series 7: coronal mpr · coronal · 0.53mm/px · 1 of 170 slices shown]
[im 85/170  mediastinal]
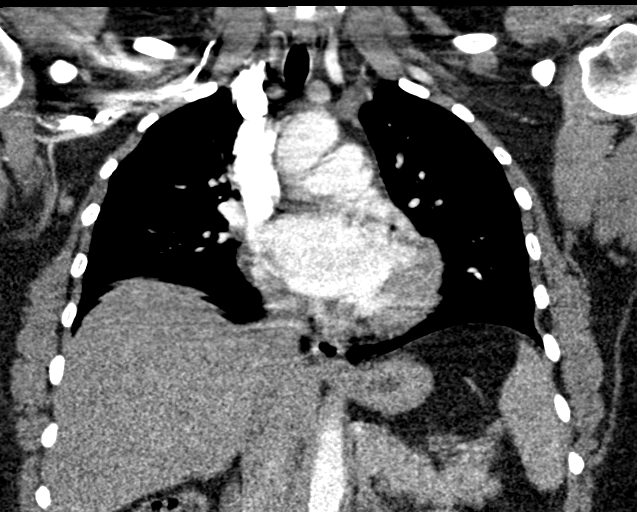

[18 of 36 positions shown; findings below may reference images not displayed]

FINDINGS: Cardiovascular: There are no filling defects within the pulmonary
arteries to suggest pulmonary embolus. The thoracic aorta is normal
in caliber. No aortic dissection. The heart is normal in size. No
pericardial effusion.

Mediastinum/Nodes: No enlarged mediastinal or hilar lymph nodes. No
esophageal wall thickening. No suspicious thyroid nodule.

Lungs/Pleura: Lungs are clear lying for motion artifact. No
consolidation or focal airspace disease. No pulmonary edema. No
pleural effusion. Trachea and central bronchi are patent.

Upper Abdomen: No acute or unexpected findings. Suspected hepatic
steatosis.

Musculoskeletal: There are no acute or suspicious osseous
abnormalities.

Review of the MIP images confirms the above findings.
IMPRESSION: No pulmonary embolus or acute intrathoracic abnormality.

## 2022-02-21 ENCOUNTER — Encounter: Payer: Self-pay | Admitting: *Deleted
